# Patient Record
Sex: Female | Born: 2000 | Race: White | Hispanic: No | Marital: Single | Smoking: Never smoker
Health system: Southern US, Community
[De-identification: ages and names within clinical notes are randomized; demographics above are authoritative.]

## PROBLEM LIST (undated history)

## (undated) DIAGNOSIS — I10 Essential (primary) hypertension: Secondary | ICD-10-CM

## (undated) HISTORY — DX: Essential (primary) hypertension: I10

## (undated) HISTORY — PX: NO PAST SURGERIES: SHX2092

---
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
## 2020-04-01 ENCOUNTER — Other Ambulatory Visit: Payer: Self-pay | Admitting: Family Medicine

## 2020-04-01 DIAGNOSIS — I10 Essential (primary) hypertension: Secondary | ICD-10-CM

## 2020-04-09 ENCOUNTER — Ambulatory Visit
Admission: RE | Admit: 2020-04-09 | Discharge: 2020-04-09 | Disposition: A | Discharge status: Home / Self Care | Payer: Medicaid Other | Source: Ambulatory Visit | Attending: Family Medicine | Admitting: Family Medicine

## 2020-04-09 DIAGNOSIS — I10 Essential (primary) hypertension: Secondary | ICD-10-CM

## 2020-07-16 ENCOUNTER — Emergency Department (HOSPITAL_COMMUNITY)
Admission: EM | Admit: 2020-07-16 | Discharge: 2020-07-16 | Discharge status: Against Medical Advice | Payer: BLUE CROSS/BLUE SHIELD

## 2020-07-16 NOTE — ED Notes (Signed)
Pt called for triage x3, no answer. 

## 2020-07-16 NOTE — ED Notes (Signed)
No answer from pt in waiting room 

## 2021-12-27 IMAGING — US US RENAL ARTERY STENOSIS
1 series · 13 of 25 positions shown · non-contrast
Comparison: None.

CLINICAL DATA: Hypertension

EXAM:
RENAL DUPLEX DOPPLER ULTRASOUND
TECHNIQUE: Duplex and color Doppler ultrasound was utilized to evaluate blood
flow in the renal arteries and kidneys.

[Series 1: us renal artery stenosis · 0.23mm/px · 13 of 93 slices shown]
[im 1/93]
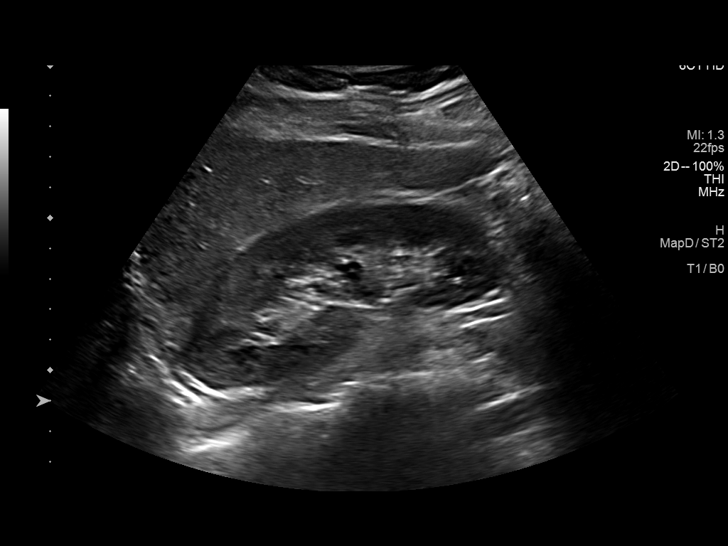
[im 8/93]
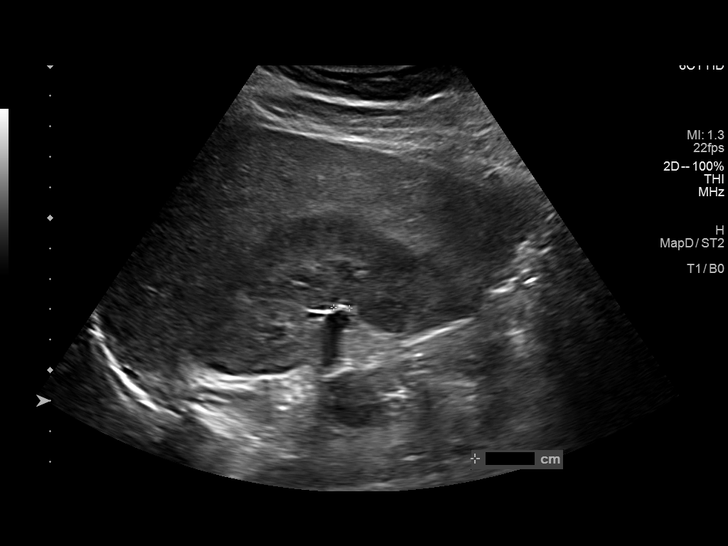
[im 16/93]
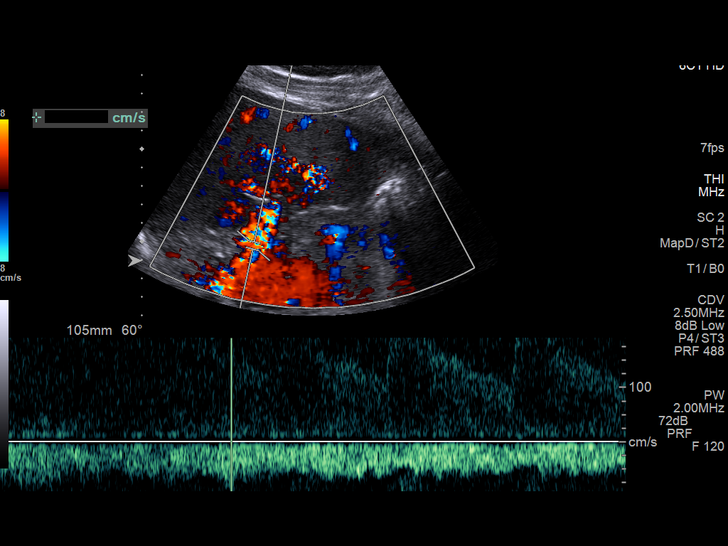
[im 24/93]
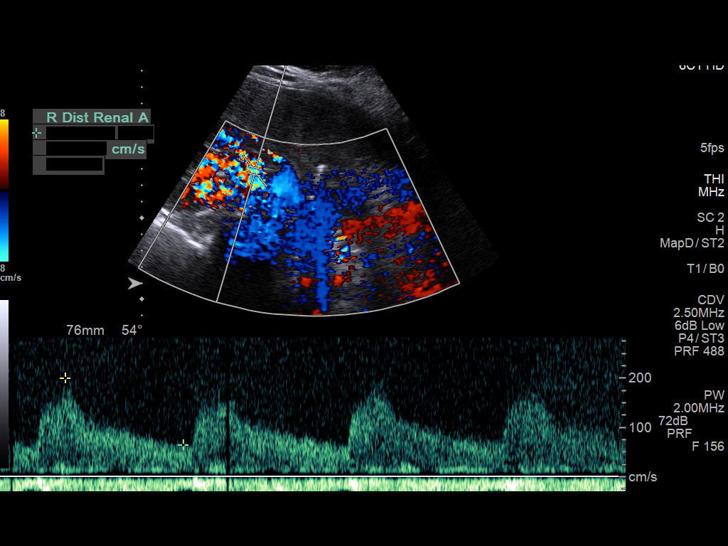
[im 31/93]
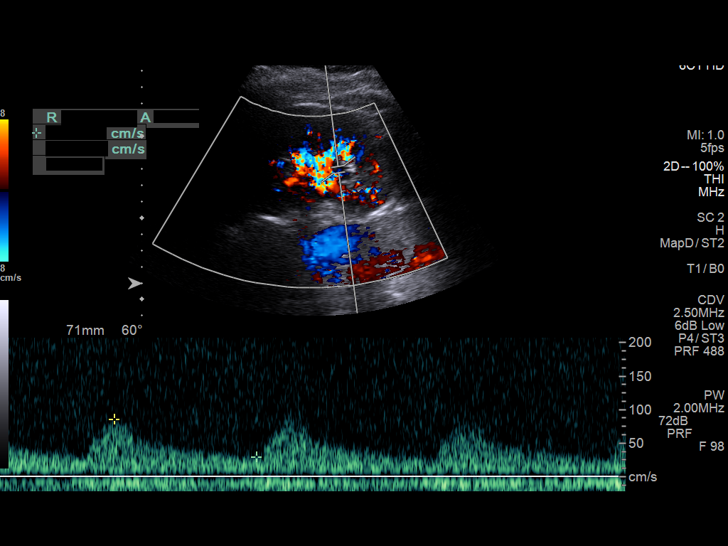
[im 39/93]
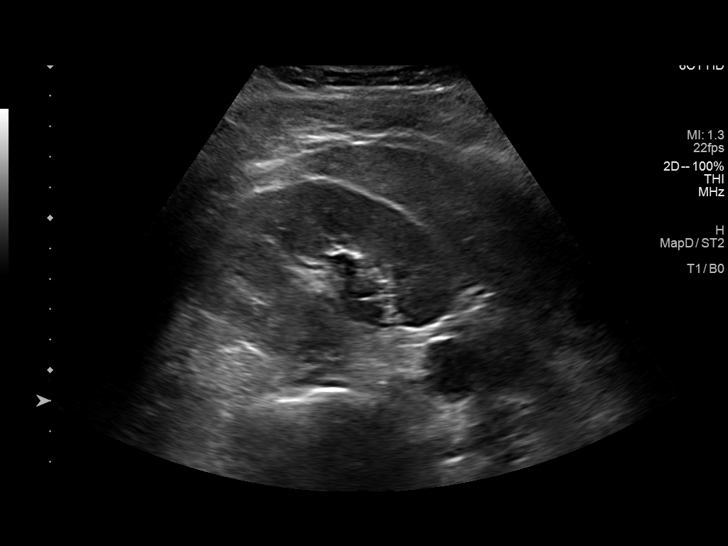
[im 47/93]
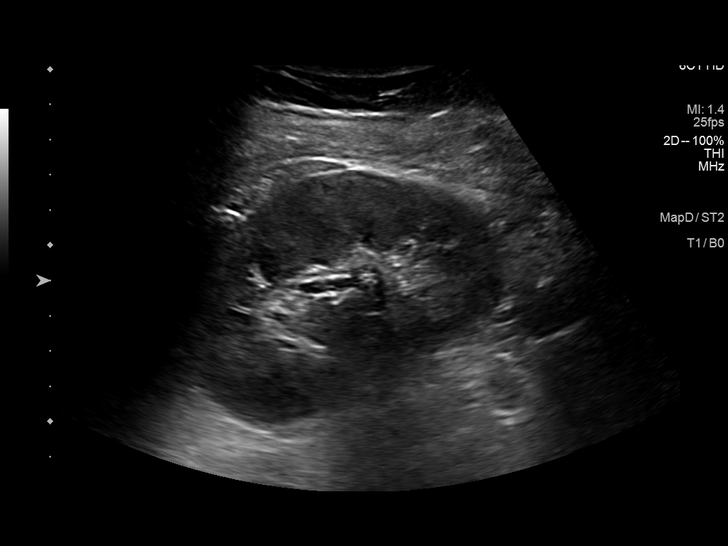
[im 54/93]
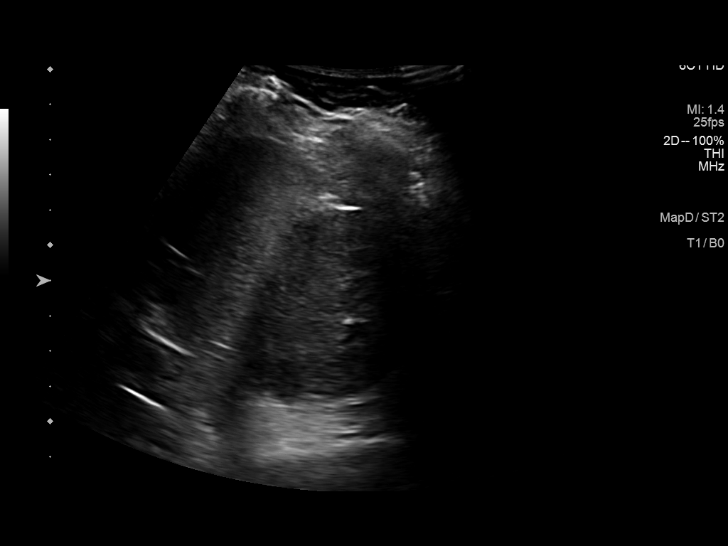
[im 62/93]
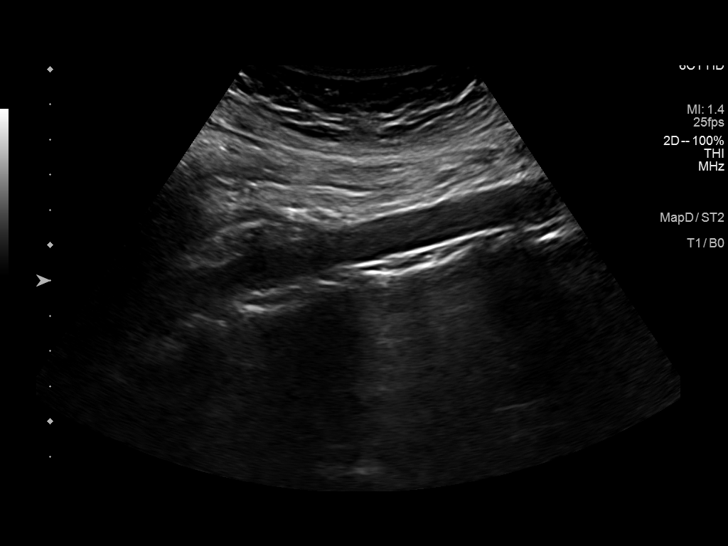
[im 70/93]
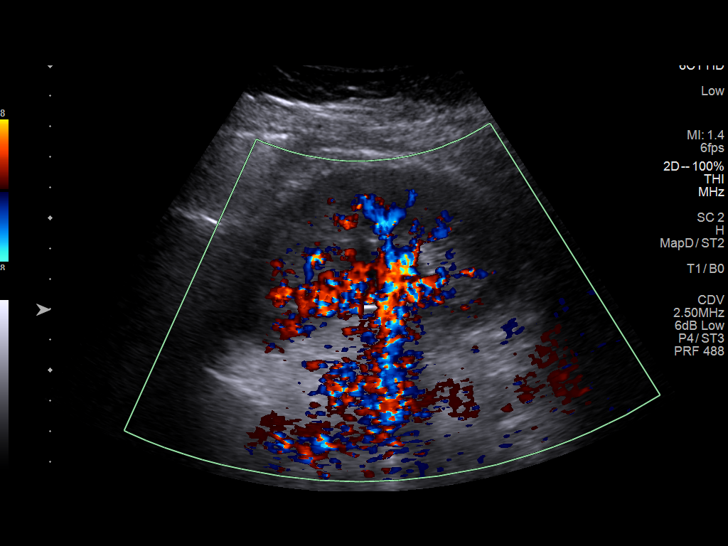
[im 77/93]
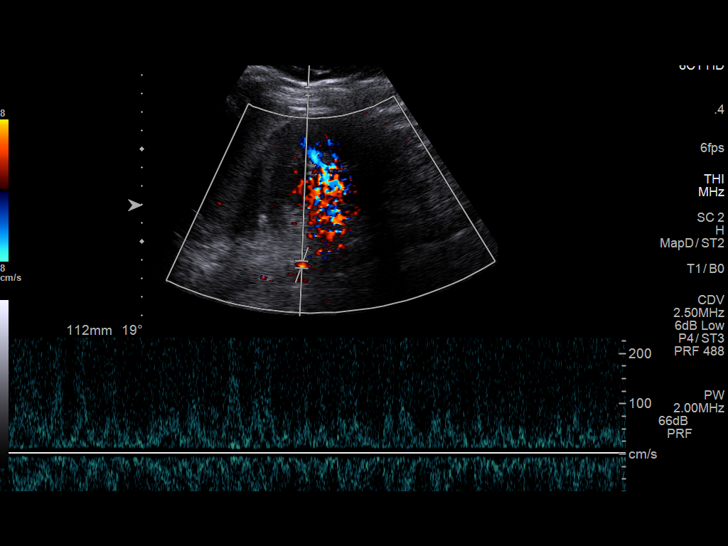
[im 85/93]
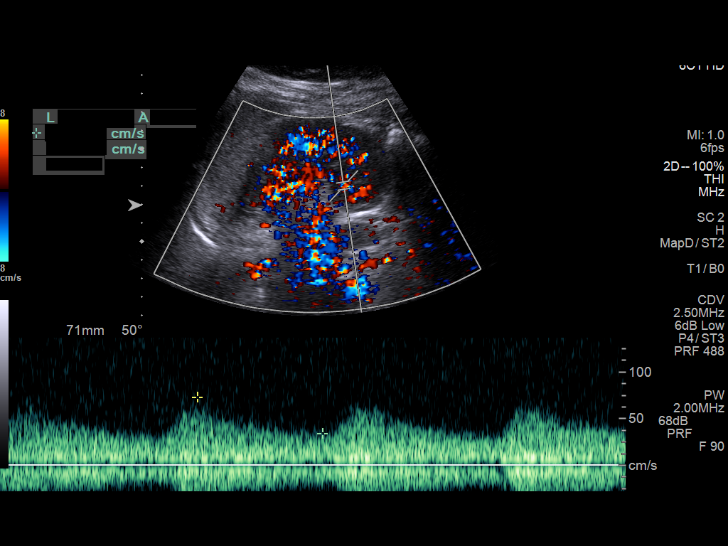
[im 93/93]
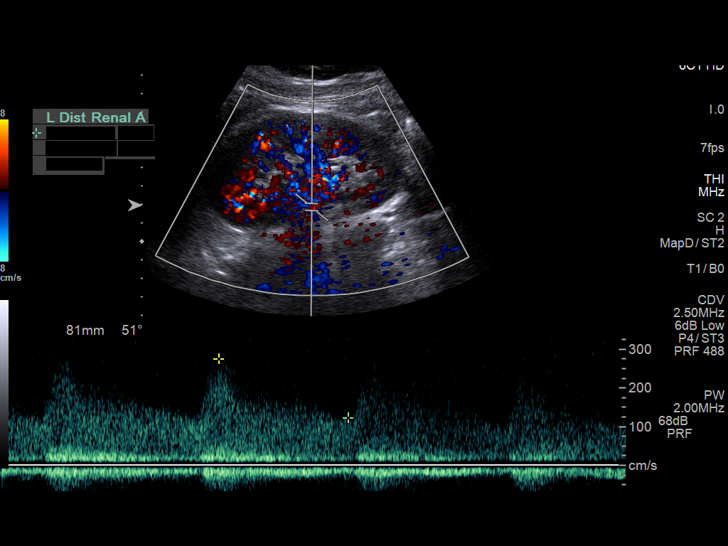

[13 of 25 positions shown; findings below may reference images not displayed]

FINDINGS: Right kidney 11.6 cm length. 1.3 cm echogenic shadowing focus
centrally in the nondilated collecting system. Renal vein patent at
hilum. Renal Artery Velocities:

Origin:  215 cm/sec

Mid:  251 cm/sec

Hilum:  247 cm/sec

Interlobar:  76 cm/sec

Arcuate: 54 cm/sec

Left kidney 10.3 cm length. No hydronephrosis. Renal vein patent at
hilum. Renal Artery Velocities:

Origin:  187 cm/sec

Mid:  148 cm/sec

Hilum:  276 cm/sec

Interlobar:  75 cm/sec

Arcuate:  31 cm/sec

Aortic Velocity: 176 cm/sec

Right Renal-Aortic Ratios:

Origin:

Mid:

Hilum:

Interlobar:

Arcuate:

Left Renal-Aortic Ratios:

Origin: 1

Mid:

Hilum:

Interlobar:

Arcuate:

Urinary bladder incompletely distended.
IMPRESSION: 1. No convincing Doppler evidence of hemodynamically significant
renal artery stenosis. If there is continued clinical concern, renal
MRA (lower radiation risk, can be performed noncontrast in the
setting of renal dysfunction) and CTA ( higher spatial resolution)
represent more accurate studies, which are additionally more
sensitive to the detection of duplicated renal arteries.
2. Possible right nephrolithiasis, without hydronephrosis.

## 2022-03-16 ENCOUNTER — Other Ambulatory Visit: Payer: Self-pay | Admitting: Obstetrics and Gynecology

## 2022-03-16 ENCOUNTER — Other Ambulatory Visit (HOSPITAL_COMMUNITY)
Admission: RE | Admit: 2022-03-16 | Discharge: 2022-03-16 | Disposition: A | Discharge status: Home / Self Care | Payer: Medicaid Other | Source: Ambulatory Visit | Attending: Obstetrics and Gynecology | Admitting: Obstetrics and Gynecology

## 2022-03-16 DIAGNOSIS — Z01419 Encounter for gynecological examination (general) (routine) without abnormal findings: Secondary | ICD-10-CM | POA: Insufficient documentation

## 2022-03-19 LAB — CYTOLOGY - PAP
Adequacy: ABSENT
Diagnosis: NEGATIVE

## 2023-03-22 ENCOUNTER — Other Ambulatory Visit (HOSPITAL_COMMUNITY)
Admission: RE | Admit: 2023-03-22 | Discharge: 2023-03-22 | Disposition: A | Discharge status: Home / Self Care | Payer: Medicaid Other | Source: Ambulatory Visit

## 2023-03-22 ENCOUNTER — Other Ambulatory Visit: Payer: Self-pay | Admitting: Obstetrics and Gynecology

## 2023-03-22 DIAGNOSIS — Z01419 Encounter for gynecological examination (general) (routine) without abnormal findings: Secondary | ICD-10-CM | POA: Diagnosis present

## 2023-03-23 LAB — CYTOLOGY - PAP: Diagnosis: NEGATIVE

## 2023-07-25 ENCOUNTER — Encounter (HOSPITAL_COMMUNITY): Payer: Self-pay

## 2023-07-25 ENCOUNTER — Other Ambulatory Visit: Payer: Self-pay

## 2023-07-25 ENCOUNTER — Emergency Department (HOSPITAL_COMMUNITY)
Admission: EM | Admit: 2023-07-25 | Discharge: 2023-07-26 | Discharge status: Against Medical Advice | Payer: Medicaid Other | Attending: Emergency Medicine | Admitting: Emergency Medicine

## 2023-07-25 DIAGNOSIS — Z5321 Procedure and treatment not carried out due to patient leaving prior to being seen by health care provider: Secondary | ICD-10-CM | POA: Insufficient documentation

## 2023-07-25 DIAGNOSIS — J029 Acute pharyngitis, unspecified: Secondary | ICD-10-CM | POA: Diagnosis present

## 2023-07-25 LAB — RESP PANEL BY RT-PCR (RSV, FLU A&B, COVID)  RVPGX2
Influenza A by PCR: NEGATIVE
Influenza B by PCR: NEGATIVE
Resp Syncytial Virus by PCR: NEGATIVE
SARS Coronavirus 2 by RT PCR: NEGATIVE

## 2023-07-25 NOTE — ED Notes (Signed)
Pt wanted to wait until she is in a room for blood word and didn't want the step swab due to having one done yesterday

## 2023-07-25 NOTE — ED Provider Triage Note (Signed)
Emergency Medicine Provider Triage Evaluation Note  Anna Holt , a 22 y.o. female  was evaluated in triage.  Pt complains of sore throat. Ongoing sore throat x 5 days.  Was diagnosed with exudative pharyngitis initially and have been taking augmentin without relief.  Subjective fever, and trouble swallowing  Review of Systems  Positive: As above Negative: As above  Physical Exam  BP (!) 150/117 (BP Location: Left Arm)   Pulse 91   Temp 99.2 F (37.3 C) (Oral)   Resp 16   Ht 5\' 8"  (1.727 m)   Wt 83.5 kg   LMP 06/21/2023 (Exact Date)   SpO2 100%   BMI 27.98 kg/m  Gen:   Awake, no distress   Resp:  Normal effort  MSK:   Moves extremities without difficulty  Other:    Medical Decision Making  Medically screening exam initiated at 8:24 PM.  Appropriate orders placed.  Anna Holt was informed that the remainder of the evaluation will be completed by another provider, this initial triage assessment does not replace that evaluation, and the importance of remaining in the ED until their evaluation is complete.     Anna Helper, PA-C 07/25/23 2026

## 2023-07-25 NOTE — ED Notes (Signed)
[facility] said pt left

## 2023-07-25 NOTE — ED Triage Notes (Signed)
Pt complaining of sore throat with swollen tonsils since Thursday. Pt seen at [facility] walk in clinic, was sent home with ABX without improvement.

## 2023-07-27 ENCOUNTER — Other Ambulatory Visit: Payer: Self-pay

## 2023-07-27 ENCOUNTER — Emergency Department (HOSPITAL_COMMUNITY)
Admission: EM | Admit: 2023-07-27 | Discharge: 2023-07-27 | Disposition: A | Discharge status: Home / Self Care | Payer: Medicaid Other | Attending: Emergency Medicine | Admitting: Emergency Medicine

## 2023-07-27 ENCOUNTER — Emergency Department (HOSPITAL_COMMUNITY): Payer: Medicaid Other

## 2023-07-27 DIAGNOSIS — R509 Fever, unspecified: Secondary | ICD-10-CM | POA: Insufficient documentation

## 2023-07-27 DIAGNOSIS — J029 Acute pharyngitis, unspecified: Secondary | ICD-10-CM | POA: Diagnosis present

## 2023-07-27 DIAGNOSIS — R1310 Dysphagia, unspecified: Secondary | ICD-10-CM | POA: Insufficient documentation

## 2023-07-27 DIAGNOSIS — I10 Essential (primary) hypertension: Secondary | ICD-10-CM | POA: Diagnosis not present

## 2023-07-27 LAB — CBC WITH DIFFERENTIAL/PLATELET
Abs Immature Granulocytes: 0.05 10*3/uL (ref 0.00–0.07)
Basophils Absolute: 0.1 10*3/uL (ref 0.0–0.1)
Basophils Relative: 1 %
Eosinophils Absolute: 0 10*3/uL (ref 0.0–0.5)
Eosinophils Relative: 0 %
HCT: 43.1 % (ref 36.0–46.0)
Hemoglobin: 13.9 g/dL (ref 12.0–15.0)
Immature Granulocytes: 0 %
Lymphocytes Relative: 19 %
Lymphs Abs: 2.1 10*3/uL (ref 0.7–4.0)
MCH: 27.4 pg (ref 26.0–34.0)
MCHC: 32.3 g/dL (ref 30.0–36.0)
MCV: 84.8 fL (ref 80.0–100.0)
Monocytes Absolute: 1.2 10*3/uL — ABNORMAL HIGH (ref 0.1–1.0)
Monocytes Relative: 11 %
Neutro Abs: 7.8 10*3/uL — ABNORMAL HIGH (ref 1.7–7.7)
Neutrophils Relative %: 69 %
Platelets: 236 10*3/uL (ref 150–400)
RBC: 5.08 MIL/uL (ref 3.87–5.11)
RDW: 12.9 % (ref 11.5–15.5)
WBC: 11.3 10*3/uL — ABNORMAL HIGH (ref 4.0–10.5)
nRBC: 0 % (ref 0.0–0.2)

## 2023-07-27 LAB — COMPREHENSIVE METABOLIC PANEL
ALT: 11 U/L (ref 0–44)
AST: 18 U/L (ref 15–41)
Albumin: 3.8 g/dL (ref 3.5–5.0)
Alkaline Phosphatase: 56 U/L (ref 38–126)
Anion gap: 13 (ref 5–15)
BUN: 11 mg/dL (ref 6–20)
CO2: 21 mmol/L — ABNORMAL LOW (ref 22–32)
Calcium: 9.1 mg/dL (ref 8.9–10.3)
Chloride: 101 mmol/L (ref 98–111)
Creatinine, Ser: 0.81 mg/dL (ref 0.44–1.00)
GFR, Estimated: >60 mL/min (ref >60)
Glucose, Bld: 87 mg/dL (ref 70–99)
Potassium: 3.7 mmol/L (ref 3.5–5.1)
Sodium: 135 mmol/L (ref 135–145)
Total Bilirubin: 0.2 mg/dL (ref 0.0–1.2)
Total Protein: 8 g/dL (ref 6.5–8.1)

## 2023-07-27 LAB — I-STAT CHEM 8, ED
BUN: 11 mg/dL (ref 6–20)
Calcium, Ion: 1.06 mmol/L — ABNORMAL LOW (ref 1.15–1.40)
Chloride: 102 mmol/L (ref 98–111)
Creatinine, Ser: 0.8 mg/dL (ref 0.44–1.00)
Glucose, Bld: 88 mg/dL (ref 70–99)
HCT: 43 % (ref 36.0–46.0)
Hemoglobin: 14.6 g/dL (ref 12.0–15.0)
Potassium: 3.6 mmol/L (ref 3.5–5.1)
Sodium: 135 mmol/L (ref 135–145)
TCO2: 22 mmol/L (ref 22–32)

## 2023-07-27 LAB — HCG, SERUM, QUALITATIVE: Preg, Serum: NEGATIVE

## 2023-07-27 LAB — GROUP A STREP BY PCR: Group A Strep by PCR: NOT DETECTED

## 2023-07-27 LAB — I-STAT CG4 LACTIC ACID, ED: Lactic Acid, Venous: 1.1 mmol/L (ref 0.5–1.9)

## 2023-07-27 LAB — MONONUCLEOSIS SCREEN: Mono Screen: NEGATIVE

## 2023-07-27 MED ORDER — DEXAMETHASONE SODIUM PHOSPHATE 10 MG/ML IJ SOLN
10.0000 mg | Freq: Once | INTRAMUSCULAR | Status: AC
  Administered 2023-07-27: 10 mg via INTRAVENOUS
  Filled 2023-07-27: qty 1

## 2023-07-27 MED ORDER — KETOROLAC TROMETHAMINE 15 MG/ML IJ SOLN
15.0000 mg | Freq: Once | INTRAMUSCULAR | Status: AC
  Administered 2023-07-27: 15 mg via INTRAVENOUS
  Filled 2023-07-27: qty 1

## 2023-07-27 MED ORDER — PREDNISONE 20 MG PO TABS: 40.0000 mg | ORAL_TABLET | Freq: Every day | ORAL | 0 refills | Status: DC

## 2023-07-27 MED ORDER — LIDOCAINE VISCOUS HCL 2 % MT SOLN: 15.0000 mL | OROMUCOSAL | 0 refills | Status: DC | PRN

## 2023-07-27 MED ORDER — IOHEXOL 350 MG/ML SOLN
75.0000 mL | Freq: Once | INTRAVENOUS | Status: AC | PRN
  Administered 2023-07-27: 75 mL via INTRAVENOUS

## 2023-07-27 MED ORDER — ACETAMINOPHEN 500 MG PO TABS
1000.0000 mg | ORAL_TABLET | Freq: Once | ORAL | Status: AC
  Administered 2023-07-27: 1000 mg via ORAL
  Filled 2023-07-27: qty 2

## 2023-07-27 MED ORDER — CLINDAMYCIN HCL 150 MG PO CAPS: 450.0000 mg | ORAL_CAPSULE | Freq: Three times a day (TID) | ORAL | 0 refills | Status: AC

## 2023-07-27 MED ORDER — SODIUM CHLORIDE 0.9 % IV BOLUS
1000.0000 mL | Freq: Once | INTRAVENOUS | Status: AC
  Administered 2023-07-27: 1000 mL via INTRAVENOUS

## 2023-07-27 MED ORDER — CLINDAMYCIN HCL 150 MG PO CAPS
450.0000 mg | ORAL_CAPSULE | Freq: Once | ORAL | Status: AC
  Administered 2023-07-27: 450 mg via ORAL
  Filled 2023-07-27: qty 3

## 2023-07-27 NOTE — ED Notes (Signed)
 Patient verbalizes understanding of discharge instructions. Opportunity for questioning and answers were provided. Armband removed by staff, pt discharged from ED. Pt ambulatory to ED waiting room with steady gait.

## 2023-07-27 NOTE — ED Triage Notes (Signed)
 Patient sent from [facility] walk in clinic for possible pharyngeal abscess. Present x 5 days and has completed five days of abx without relief and now having difficulty swallowing.

## 2023-07-27 NOTE — ED Provider Notes (Signed)
 [place] EM[facility] DEPARTMENT AT [facility] Provider Note   CSN: [id number] Arrival date & time: 07/27/23  1211     History Chief Complaint  Patient presents with   Abscess    Anna Holt is a 22 y.o. female with h/o HTN presents to the ER for evaluation of sore throat for the past 6 days.  She reports that she was on penicillin for 2 days however did not have any improvement with back to [place].  She was then put on Augmentin.  Still was not having improvement of symptoms went back to [facility] and was told to return to the ER.  She went to [place] however he left before being seen.  Shorts that she is been having fevers off and on for the past few days.  Tmax 101.15F yesterday.  She denies any runny nose or nasal congestion.  She reports pain with swallowing but no trouble swallowing or any trouble breathing.  She took ibuprofen today at 0600.  She denies any drooling.  Denies any rashes or cough.  She had her tonsils and adenoids removed 14 years ago.  Has had tympanic tubes put in as well.  Daily medications includes metoprolol.  She is allergic to lisinopril.  Vapes.  Denies any consistent EtOH use.  Denies any illicit drug use.   Abscess Associated symptoms: fever   Associated symptoms: no nausea and no vomiting        Home Medications Prior to Admission medications   Not on File      Allergies    Lisinopril    Review of Systems   Review of Systems  Constitutional:  Positive for chills and fever.  HENT:  Positive for sore throat. Negative for congestion, rhinorrhea and trouble swallowing.   Respiratory:  Negative for shortness of breath.   Gastrointestinal:  Negative for abdominal pain, nausea and vomiting.  Genitourinary:  Negative for dysuria and hematuria.  Skin:  Negative for rash.    Physical Exam Updated Vital Signs BP (!) 160/115   Pulse 94   Temp 99.7 F (37.6 C) (Oral)   Resp 19   Ht 5' 7 (1.702 m)   Wt 81.2 kg   LMP 06/21/2023 (Exact  Date)   SpO2 100%   BMI 28.04 kg/m  Physical Exam Vitals and nursing note reviewed.  Constitutional:      Appearance: She is not toxic-appearing.     Comments: Uncomfortable, but nontoxic-appearing  HENT:     Right Ear: Ear canal and external ear normal.     Left Ear: Ear canal and external ear normal.     Ears:     Comments: Scarring noted to bilateral TMs.  Patient reports this is chronic for her since she got tubes.    Mouth/Throat:     Mouth: Mucous membranes are moist.     Comments: Moist mucous membranes.  Uvula midline.  Airway patent.  Patient has classic hot potato voice.  No sublingual elevation.  No trismus.  Controlling secretions.  Patient has solid exudate present on the right side of the oropharynx.  It is 1+ swelling.  No significant pharyngeal erythema otherwise. Eyes:     General: No scleral icterus. Cardiovascular:     Rate and Rhythm: Normal rate.  Pulmonary:     Effort: Pulmonary effort is normal. No respiratory distress.     Breath sounds: Normal breath sounds. No stridor.  Lymphadenopathy:     Cervical: Cervical adenopathy present.  Skin:  General: Skin is warm and dry.  Neurological:     Mental Status: She is alert.     ED Results / Procedures / Treatments   Labs (all labs ordered are listed, but only abnormal results are displayed) Labs Reviewed  COMPREHENSIVE METABOLIC PANEL - Abnormal; Notable for the following components:      Result Value   CO2 21 (*)    All other components within normal limits  CBC WITH DIFFERENTIAL/PLATELET - Abnormal; Notable for the following components:   WBC 11.3 (*)    Neutro Abs 7.8 (*)    Monocytes Absolute 1.2 (*)    All other components within normal limits  I-STAT CHEM 8, ED - Abnormal; Notable for the following components:   Calcium, Ion 1.06 (*)    All other components within normal limits  GROUP A STREP BY PCR  HCG, SERUM, QUALITATIVE  MONONUCLEOSIS SCREEN  I-STAT CG4 LACTIC ACID, ED  I-STAT CG4  LACTIC ACID, ED    EKG None  Radiology CT Soft Tissue Neck W Contrast Result Date: 07/27/2023 CLINICAL DATA:  Epiglottitis or tonsillitis suspected. Difficulty swallowing. EXAM: CT NECK WITH CONTRAST TECHNIQUE: Multidetector CT imaging of the neck was performed using the standard protocol following the bolus administration of intravenous contrast. RADIATION DOSE REDUCTION: This exam was performed according to the departmental dose-optimization program which includes automated exposure control, adjustment of the mA and/or kV according to patient size and/or use of iterative reconstruction technique. CONTRAST:  75mL OMNIPAQUE  IOHEXOL  350 MG/ML SOLN COMPARISON:  None Available. FINDINGS: Pharynx and larynx: Edema and hyperenhancement of the adenoids, palatine tonsils and lingual tonsils, consistent with pharyngitis. Salivary glands: No inflammation, mass, or stone. Thyroid: Normal. Lymph nodes: Right-greater-than-left upper cervical lymphadenopathy, likely reactive. Vascular: Normal. Limited intracranial: Within limits of motion artifact, no significant abnormality. Visualized orbits: Evaluation is limited by motion artifact. Mastoids and visualized paranasal sinuses: Trace mucosal thickening in the left maxillary sinus, likely physiologic. Mastoids are well aerated. Skeleton: Normal. Upper chest: Unremarkable. Other: None. IMPRESSION: 1. Edema and hyperenhancement of the adenoids, palatine tonsils and lingual tonsils, consistent with pharyngitis. No evidence of peritonsillar abscess. 2. Right-greater-than-left upper cervical lymphadenopathy, likely reactive. Electronically Signed   By: Ryan Chess M.D.   On: 07/27/2023 18:43    Procedures Procedures   Medications Ordered in ED Medications  ketorolac  (TORADOL ) 15 MG/ML injection 15 mg (15 mg Intravenous Given 07/27/23 1646)  acetaminophen  (TYLENOL ) tablet 1,000 mg (1,000 mg Oral Given 07/27/23 1648)  iohexol  (OMNIPAQUE ) 350 MG/ML injection 75 mL (75  mLs Intravenous Contrast Given 07/27/23 1710)    ED Course/ Medical Decision Making/ A&P                               Medical Decision Making Amount and/or Complexity of Data Reviewed Labs: ordered. Radiology: ordered.  Risk OTC drugs. Prescription drug management.   23 y.o. female presents to the ER for evaluation of sore throat/pharyngeal abscess. Differential diagnosis includes but is not limited to Viral pharyngitis, strep pharyngitis, dental caries/abscess, esophagitis, sinusitis, post nasal drip, reflux, angioedema, RTA/PTA, Ludwig's angina. Vital signs elevated BP, temp 99.78F, otherwise unremarkable. Physical exam as noted above.   Some labs ordered in triage. I have reminded nursing about a lactic needing to be collected. Mono and strep ordered as well. CT soft tissue ordered to evaluate if potential abscess.  Ordered the patient Tylenol  and Toradol .  I independently reviewed and interpreted the patient's labs.  hCG negative.  CBC shows white blood cell count elevated 11.3 with a left shift.  CMP shows bicarb decreased at 21 otherwise no electrolyte or LFT abnormality.  Mono negative.  Strep negative.  Lactic within normal limits.  CT soft tissue neck shows  1. Edema and hyperenhancement of the adenoids, palatine tonsils and  lingual tonsils, consistent with pharyngitis. No evidence of  peritonsillar abscess.  2. Right-greater-than-left upper cervical lymphadenopathy, likely  reactive.  Per radiologist's interpretation.    I spoke with Dr. Roark with ear nose and throat.  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  Recommend switching antibiotics to clindamycin .  Giving some Decadron  here and sending home with prednisone .  Will have her follow-up with her primary care doctor.  Offered her shared decision with admission versus discharge home.  I discussed admission versus discharge home with patient.  We had a shared decision making.  Patient reports he is feeling so  much better after the Toradol  and Tylenol .  I agree that her voice is improved.  She reports that it is not causing her as much pain to swallow.  She has been able to drink without issue.  I given her another dose of Toradol  given it has been 6 hours later.  I will send her home with the clindamycin .  First dose given today.  Also send her home with some prednisone  and viscous lidocaine .  She is controlling secretions.  She reports that she feels significantly better than she did earlier. She would like to go home. Recommended she follow up with PCP.   We discussed the results of the labs/imaging. The plan is take medications as prescribed, follow up with PCP. We discussed strict return precautions and red flag symptoms. The patient verbalized their understanding and agrees to the plan. The patient is stable and being discharged home in good condition.  Portions of this report may have been transcribed using voice recognition software. Every effort was made to ensure accuracy; however, inadvertent computerized transcription errors may be present.   Final Clinical Impression(s) / ED Diagnoses Final diagnoses:  Pharyngitis, unspecified etiology    Rx / DC Orders ED Discharge Orders          Ordered    clindamycin  (CLEOCIN ) 150 MG capsule  3 times daily        07/27/23 2033    predniSONE  (DELTASONE ) 20 MG tablet  Daily        07/27/23 2238    lidocaine  (XYLOCAINE ) 2 % solution  As needed        07/27/23 2243              Bernis Ernst, PA-C 07/27/23 2253    Laurice Maude BROCKS, MD 07/28/23 0130

## 2023-07-27 NOTE — Discharge Instructions (Addendum)
 You were seen in the ER today for evaluation of your sore throat. Your imaging shows that you have pharyngitis. There was no abscess seen. For this, I am going to switch you to an antibiotic called clindamycin  which you will take three times a day for the next ten days. You can use your viscous lidocaine  mouthwash as needed. For pain, I recommend 600mg  of ibuprofen and 1000mg  of Tylenol  every 6 hours as needed for pain. Make sure that you are staying well hydrated, drinking plenty of fluids, mainly water. Please make sure you follow up with your PCP for re-evaluation. If you have any concerns, new or worsening symptoms, please return to the nearest ER for re-evaluation.   Contact a doctor if: You have large, tender lumps in your neck. You have a rash. You cough up green, yellow-brown, or bloody spit. Get help right away if: You have a stiff neck. You drool or cannot swallow liquids. You cannot drink or take medicines without vomiting. You have very bad pain that does not go away with medicine. You have problems breathing, and it is not from a stuffy nose. You have new pain and swelling in your knees, ankles, wrists, or elbows. These symptoms may be an emergency. Get help right away. Call your local emergency services (911 in the [place] Do not wait to see if the symptoms will go away. Do not drive yourself to the hospital.

## 2023-07-31 ENCOUNTER — Emergency Department (HOSPITAL_COMMUNITY)
Admission: EM | Admit: 2023-07-31 | Discharge: 2023-07-31 | Disposition: A | Discharge status: Home / Self Care | Payer: Medicaid Other | Attending: Emergency Medicine | Admitting: Emergency Medicine

## 2023-07-31 DIAGNOSIS — J029 Acute pharyngitis, unspecified: Secondary | ICD-10-CM | POA: Diagnosis present

## 2023-07-31 DIAGNOSIS — Z20822 Contact with and (suspected) exposure to covid-19: Secondary | ICD-10-CM | POA: Diagnosis not present

## 2023-07-31 LAB — GROUP A STREP BY PCR: Group A Strep by PCR: NOT DETECTED

## 2023-07-31 LAB — RESP PANEL BY RT-PCR (RSV, FLU A&B, COVID)  RVPGX2
Influenza A by PCR: NEGATIVE
Influenza B by PCR: NEGATIVE
Resp Syncytial Virus by PCR: NEGATIVE
SARS Coronavirus 2 by RT PCR: NEGATIVE

## 2023-07-31 LAB — MONONUCLEOSIS SCREEN: Mono Screen: NEGATIVE

## 2023-07-31 MED ORDER — METHYLPREDNISOLONE 4 MG PO TBPK: ORAL_TABLET | ORAL | 0 refills | Status: DC

## 2023-07-31 NOTE — ED Provider Triage Note (Signed)
 Emergency Medicine Provider Triage Evaluation Note  Anna Holt , a 23 y.o. female  was evaluated in triage.  Pt complains of Sore throat x 1 wk.  Review of Systems  Positive: Sore throat, pain from neck rad to ear/mastoid Negative: Fever, chills, N/V, body aches, congestion, cough,   Physical Exam  LMP 06/21/2023 (Exact Date)  Gen:   Awake, no distress   Resp:  Normal effort  MSK:   Moves extremities without difficulty  Other:  Exudate on pharynx, post tonsillectomy, R sided cervical lymphadenopathy  Medical Decision Making  Medically screening exam initiated at 11:46 AM.  Appropriate orders placed.  Delaina Wickware was informed that the remainder of the evaluation will be completed by another provider, this initial triage assessment does not replace that evaluation, and the importance of remaining in the ED until their evaluation is complete.  Labs sent   Francis Ileana SAILOR, PA-C 07/31/23 1152

## 2023-07-31 NOTE — ED Triage Notes (Signed)
 Pt c/o sore throat for several days. On abx since 1/1.

## 2023-07-31 NOTE — Discharge Instructions (Addendum)
 Today you were seen for sore throat.  Pick up your medication and take as prescribed.  Please see the attached handout for symptomatic management.  You have 1 or more labs pending and will be notified once these labs results and be given appropriate treatment.  Thank you for letting us  treat you today. After performing a physical exam and reviewing your labs, I feel you are safe to go home. Please follow up with your PCP in the next several days and provide them with your records from this visit. Return to the Emergency Room if pain becomes severe or symptoms worsen.

## 2023-07-31 NOTE — ED Provider Notes (Signed)
  EMERGENCY DEPARTMENT AT [facility] Provider Note   CSN: [id number] Arrival date & time: 07/31/23  1140     History  Chief Complaint  Patient presents with   Sore Throat    Anna Holt is a 23 y.o. female presents today for sore throat x 1 week.  Patient endorses sore throat and pain on the right side of her throat radiating up to ear/mastoid area.  Patient denies fever, chills, nausea, vomiting, body aches, congestion, or cough.  Patient denies shortness of breath or chest pain.  Patient is status post tonsillectomy.   Sore Throat       Home Medications Prior to Admission medications   Medication Sig Start Date End Date Taking? Authorizing Provider  methylPREDNISolone  (MEDROL  DOSEPAK) 4 MG TBPK tablet Take as packaging instructs 07/31/23  Yes Adah Stoneberg N, PA-C  clindamycin  (CLEOCIN ) 150 MG capsule Take 3 capsules (450 mg total) by mouth 3 (three) times daily for 10 days. 07/27/23 08/06/23  Bernis Ernst, PA-C  lidocaine  (XYLOCAINE ) 2 % solution Use as directed 15 mLs in the mouth or throat as needed for mouth pain. 07/27/23   Bernis Ernst, PA-C      Allergies    Lisinopril    Review of Systems   Review of Systems  HENT:  Positive for sore throat.     Physical Exam Updated Vital Signs BP (!) 148/91   Pulse 92   Temp (!) 97.3 F (36.3 C) (Oral)   Resp 18   LMP 06/21/2023 (Exact Date)   SpO2 100%  Physical Exam Constitutional:      Appearance: She is well-developed.  HENT:     Head: Normocephalic.     Right Ear: Ear canal normal. No mastoid tenderness. Tympanic membrane is scarred. Tympanic membrane is not erythematous.     Left Ear: Ear canal normal. No mastoid tenderness. Tympanic membrane is scarred. Tympanic membrane is not erythematous.     Ears:     Comments: Scarring on bilateral TMs from previous tympanostomy    Nose: No congestion.     Mouth/Throat:     Mouth: Mucous membranes are moist.     Tongue: No lesions. Tongue does not  deviate from midline.     Pharynx: Uvula midline. Oropharyngeal exudate present. No posterior oropharyngeal erythema or uvula swelling.     Tonsils: No tonsillar exudate or tonsillar abscesses.  Eyes:     Conjunctiva/sclera: Conjunctivae normal.  Neck:     Comments: Mild right sided cervical lymphadenopathy Cardiovascular:     Rate and Rhythm: Normal rate.     Heart sounds: Normal heart sounds.  Musculoskeletal:     Cervical back: Normal range of motion.  Lymphadenopathy:     Cervical: Cervical adenopathy present.  Neurological:     Mental Status: She is alert.     ED Results / Procedures / Treatments   Labs (all labs ordered are listed, but only abnormal results are displayed) Labs Reviewed  GROUP A STREP BY PCR  RESP PANEL BY RT-PCR (RSV, FLU A&B, COVID)  [id number]  AEROBIC CULTURE W GRAM STAIN (SUPERFICIAL SPECIMEN)  MONONUCLEOSIS SCREEN    EKG None  Radiology No results found.  Procedures Procedures    Medications Ordered in ED Medications - No data to display  ED Course/ Medical Decision Making/ A&P  Medical Decision Making Amount and/or Complexity of Data Reviewed Labs: ordered.   This patient presents to the ED with chief complaint(s) of sore throat with pertinent past medical history of none which further complicates the presenting complaint. The complaint involves an extensive differential diagnosis and also carries with it a high risk of complications and morbidity.    The differential diagnosis includes strep pharyngitis, COVID, flu, mono, RSV  Additional history obtained: Records reviewed Care Everywhere/External Records  ED Course and Reassessment:   Independent labs interpretation:  The following labs were independently interpreted:  Respiratory panel: Negative Strep PCR: Negative Throat culture: Pending Monospot: Negative  Consultation: - Consulted or discussed management/test interpretation w/ external  professional: None  Consideration for admission or further workup: Considered for mission or further workup however patient's vital signs, physical exam, and labs have all been reassuring.  Patient is still pending throat culture result and will be treated appropriately once this test is resulted.  Patient should follow-up with PCP if symptoms persist for further evaluation and workup.  Patient will be given outpatient treatment with over-the-counter remedies such as salt water gargles and Medrol  Dosepak.         Final Clinical Impression(s) / ED Diagnoses Final diagnoses:  Sore throat    Rx / DC Orders ED Discharge Orders          Ordered    methylPREDNISolone  (MEDROL  DOSEPAK) 4 MG TBPK tablet        07/31/23 1648              Francis Ileana SAILOR, PA-C 07/31/23 1648    Mannie Pac T, DO 08/02/23 1522

## 2023-08-02 LAB — AEROBIC CULTURE W GRAM STAIN (SUPERFICIAL SPECIMEN): Gram Stain: NONE SEEN

## 2023-08-03 ENCOUNTER — Telehealth (HOSPITAL_BASED_OUTPATIENT_CLINIC_OR_DEPARTMENT_OTHER): Payer: Self-pay

## 2023-08-03 NOTE — Telephone Encounter (Signed)
 Post ED Visit - Positive Culture Follow-up  Culture report reviewed by antimicrobial stewardship pharmacist: [facility] [x]  Dorn Poot, Pharm.D. []  Venetia Gully, Pharm.D., BCPS AQ-ID []  Garrel Crews, Pharm.D., BCPS []  Almarie Lunger, Pharm.D., BCPS []  Damon, [place].D., BCPS, AAHIVP []  Rosaline Bihari, Pharm.D., BCPS, AAHIVP []  Vernell Meier, PharmD, BCPS []  Latanya Hint, PharmD, BCPS []  Donald Medley, PharmD, BCPS []  Rocky Bold, PharmD []  Dorothyann Alert, PharmD, BCPS []  Morene Babe, PharmD  [facility] []  Rosaline Edison, PharmD []  Romona Bliss, PharmD []  Dolphus Roller, PharmD []  Veva Seip, Rph []  Vernell Daunt) Leonce, PharmD []  Eva Allis, PharmD []  Rosaline Millet, PharmD []  Iantha Batch, PharmD []  Arvin Gauss, PharmD []  Wanda Hasting, PharmD []  Ronal Rav, PharmD []  Rocky Slade, PharmD []  Bard Jeans, PharmD   Positive throat culture No treatment needed and no further patient follow-up is required at this time.  Anna Holt 08/03/2023, 10:38 AM

## 2023-11-03 ENCOUNTER — Encounter (HOSPITAL_BASED_OUTPATIENT_CLINIC_OR_DEPARTMENT_OTHER): Payer: Self-pay | Admitting: Family

## 2023-11-03 ENCOUNTER — Ambulatory Visit (HOSPITAL_BASED_OUTPATIENT_CLINIC_OR_DEPARTMENT_OTHER): Admitting: Family

## 2023-11-03 VITALS — BP 146/90 | HR 83 | Ht 67.0 in | Wt 198.8 lb

## 2023-11-03 DIAGNOSIS — I10 Essential (primary) hypertension: Secondary | ICD-10-CM

## 2023-11-03 MED ORDER — NIFEDIPINE ER OSMOTIC RELEASE 30 MG PO TB24: 30.0000 mg | ORAL_TABLET | Freq: Every day | ORAL | 1 refills | Status: DC

## 2023-11-03 NOTE — Patient Instructions (Addendum)
 Medication Instructions:  Your physician has recommended you make the following change in your medication:   Stop: Metoprolol   Start: Nifedipine 30mg  daily    Labwork: Your physician recommends that you return for lab work in one week Renin-aldosterone, metanephrines, and catecholamines    Follow-Up: Please follow up in 2 months in [facility] with Dr. Duke Salvia, Gillian Shields, NP or Phillips Hay PharmD    Special Instructions:  We will check on your blood pressure in 2 weeks

## 2023-11-03 NOTE — Progress Notes (Signed)
 [facility]pertension Clinic Initial Assessment:    Date:  11/03/2023   ID:  Anna Holt, DOB August 18, 2000, MRN [id number]  PCP:  Irven Coe, MD  Cardiologist:  None  Nephrologist:  Referring MD: Irven Coe, MD   CC: Hypertension  History of Present Illness:    Anna Holt is a 23 y.o. female with a hx of hypertension here to establish care in the [facility]pert[facility].   Discussed the use of AI scribe software for clinical note transcription with the patient, who gave verbal consent to proceed.  History of Present Illness The patient, with a known history of hypertension diagnosed at 23 yo, presents with concerns about her current medication, metoprolol. She reports feeling sluggish and experiencing significant weight gain since starting the medication three years ago.  These symtpoms have persisted despite reducing dose from 100mg  to 50mg . Prior antihypertensive of Lisinopril which she did not tolerate due to dizziness, nausea. The patient has been monitoring her blood pressure manually at home and reports readings around 150/80. She expresses a desire to manage her hypertension with lifestyle modifications and potentially a different medication. The patient has a family history of hypertension in her mother and maternal grandfather. She also recently completed a sleep study due to concerns about sleep apnea and awaits results. The patient is active, engaging in weightlifting and cardio exercises multiple times a week. She reports a mix of home-cooked meals and eating out, with a noted preference for adding salt to her food. No tobacco use, alcohol use socially. Previously smoked THC but has since stopped.    Previous antihypertensives: Lisinopril - dizziness, nausea   Past Medical History:  Diagnosis Date   Hypertension     Past Surgical History:  Procedure Laterality Date   NO PAST SURGERIES      Current Medications: Current Meds  Medication Sig    NIFEdipine (PROCARDIA-XL/NIFEDICAL-XL) 30 MG 24 hr tablet Take 1 tablet (30 mg total) by mouth daily.   [DISCONTINUED] metoprolol succinate (TOPROL-XL) 50 MG 24 hr tablet Take 50 mg by mouth daily.     Allergies:   Lisinopril and Norethindrone   Social History   Socioeconomic History   Marital status: Single    Spouse name: Not on file   Number of children: Not on file   Years of education: Not on file   Highest education level: Not on file  Occupational History   Not on file  Tobacco Use   Smoking status: Never   Smokeless tobacco: Never  Substance and Sexual Activity   Alcohol use: Never   Drug use: Never   Sexual activity: Not on file  Other Topics Concern   Not on file  Social History Narrative   Not on file   Social Drivers of Health   Financial Resource Strain: Not on file  Food Insecurity: No Food Insecurity (11/03/2023)   Hunger Vital Sign    Worried About Running Out of Food in the Last Year: Never true    Ran Out of Food in the Last Year: Never true  Transportation Needs: No Transportation Needs (11/03/2023)   PRAPARE - Administrator, Civil Service (Medical): No    Lack of Transportation (Non-Medical): No  Physical Activity: Sufficiently Active (11/03/2023)   Exercise Vital Sign    Days of Exercise per Week: 5 days    Minutes of Exercise per Session: 60 min  Stress: Not on file  Social Connections: Not on file     Family History:  The patient's family history includes Hypertension in her maternal grandfather and mother.  ROS:   Please see the history of present illness.     All other systems reviewed and are negative.  EKGs/Labs/Other Studies Reviewed:    EKG Interpretation Date/Time:  Thursday November 03 2023 10:04:36 EDT Ventricular Rate:  85 PR Interval:  126 QRS Duration:  92 QT Interval:  386 QTC Calculation: 459 R Axis:   57  Text Interpretation: Normal sinus rhythm No acute ST/T wave changes Confirmed by Gillian Shields ([id number])  on 11/03/2023 11:46:40 AM    Recent Labs: 07/27/2023: ALT 11; BUN 11; Creatinine, Ser 0.80; Hemoglobin 14.6; Platelets 236; Potassium 3.6; Sodium 135   Recent Lipid Panel No results found for: "CHOL", "TRIG", "HDL", "CHOLHDL", "VLDL", "LDLCALC", "LDLDIRECT"  Physical Exam:   VS:  BP (!) 146/90   Pulse 83   Ht 5\' 7"  (1.702 m)   Wt 198 lb 12.8 oz (90.2 kg)   SpO2 100%   BMI 31.14 kg/m  , BMI Body mass index is 31.14 kg/m.  Vitals:   11/03/23 1009 11/03/23 1011 11/03/23 1044  BP: (!) 174/143 (!) 185/100 (!) 146/90  Pulse: 83    Height: 5\' 7"  (1.702 m)    Weight: 198 lb 12.8 oz (90.2 kg)    SpO2: 100%    BMI (Calculated): 31.13      GENERAL:  Well appearing HEENT: Pupils equal round and reactive, fundi not visualized, oral mucosa unremarkable NECK:  No jugular venous distention, waveform within normal limits, carotid upstroke brisk and symmetric, no bruits, no thyromegaly LYMPHATICS:  No cervical adenopathy LUNGS:  Clear to auscultation bilaterally HEART:  RRR.  PMI not displaced or sustained,S1 and S2 within normal limits, no S3, no S4, no clicks, no rubs, no  murmurs ABD:  Flat, positive bowel sounds normal in frequency in pitch, no bruits, no rebound, no guarding, no midline pulsatile mass, no hepatomegaly, no splenomegaly EXT:  2 plus pulses throughout, no edema, no cyanosis no clubbing SKIN:  No rashes no nodules NEURO:  Cranial nerves II through XII grossly intact, motor grossly intact throughout PSYCH:  Cognitively intact, oriented to person place and time   ASSESSMENT/PLAN:    Assessment and Plan Assessment & Plan Hypertension Hypertension diagnosed at 28 with family history. Current home readings 140-150s/80s mmHg. initial BP in clinic 174/143 and 185/100 with repeat 146/90 without intervention.  Previous treatments with lisinopril and metoprolol caused adverse effects.  - Discontinue metoprolol. - Initiate nifedipine 30 mg once daily. - Educated on low-sodium diet  and lifestyle modifications.  Ultimately her goal is to control blood pressure through lifestyle changes. - Recheck blood pressure in two weeks via MyChart. - Lab work next week plasma metanephrines, catecholamines, renin aldosterone.  Sleep Apnea (suspected) Sleep apnea suspected as a contributing factor to hypertension. - Established with [facility], await sleep study results.    Screening for Secondary Hypertension:     11/03/2023   11:52 AM  Causes  Drugs/Herbals Screened     - Comments no OTC agents, limits caffeine  Renovascular HTN Screened     - Comments 2021 normal renal arteries by duplex  Sleep Apnea Screened     - Comments 10/2023 recently completed sleep study, await results from [facility]  Thyroid Disease Screened     - Comments normal TSH  Hyperaldosteronism Screened     - Comments 11/03/23 renin-aldosterone ordered  Pheochromocytoma Screened     - Comments 11/03/23 metanephrines, catecholamines ordered  Cushing's Syndrome N/A     - Comments non cushingoid appearance  Coarctation of the Aorta N/A     - Comments BP symmetrical  Compliance Screened     - Comments Taking medication routinely    Relevant Labs/Studies:    Latest Ref Rng & Units 07/27/2023   12:51 PM 07/27/2023   12:39 PM  Basic Labs  Sodium 135 - 145 mmol/L 135  135   Potassium 3.5 - 5.1 mmol/L 3.6  3.7   Creatinine 0.44 - 1.00 mg/dL 8.11  9.14                     Disposition:    FU with MD/APP/PharmD in 2 months    Medication Adjustments/Labs and Tests Ordered: Current medicines are reviewed at length with the patient today.  Concerns regarding medicines are outlined above.  Orders Placed This Encounter  Procedures   Aldosterone + renin activity w/ ratio   Catecholamines, fractionated, plasma   Metanephrines, plasma   EKG 12-Lead   Meds ordered this encounter  Medications   NIFEdipine (PROCARDIA-XL/NIFEDICAL-XL) 30 MG 24 hr tablet    Sig: Take 1 tablet (30 mg  total) by mouth daily.    Dispense:  90 tablet    Refill:  1    Supervising Provider:   Jodelle Red [[id number]]     Signed, Alver Sorrow, NP  11/03/2023 11:53 AM    [facility]

## 2023-11-17 ENCOUNTER — Encounter (HOSPITAL_BASED_OUTPATIENT_CLINIC_OR_DEPARTMENT_OTHER): Payer: Self-pay

## 2023-12-19 ENCOUNTER — Other Ambulatory Visit: Payer: Self-pay

## 2023-12-19 ENCOUNTER — Encounter (HOSPITAL_COMMUNITY): Payer: Self-pay

## 2023-12-19 ENCOUNTER — Emergency Department (HOSPITAL_COMMUNITY)

## 2023-12-19 ENCOUNTER — Emergency Department (HOSPITAL_COMMUNITY)
Admission: EM | Admit: 2023-12-19 | Discharge: 2023-12-19 | Disposition: A | Discharge status: Home / Self Care | Attending: Emergency Medicine | Admitting: Emergency Medicine

## 2023-12-19 DIAGNOSIS — R059 Cough, unspecified: Secondary | ICD-10-CM | POA: Diagnosis present

## 2023-12-19 DIAGNOSIS — J069 Acute upper respiratory infection, unspecified: Secondary | ICD-10-CM | POA: Insufficient documentation

## 2023-12-19 DIAGNOSIS — Z79899 Other long term (current) drug therapy: Secondary | ICD-10-CM | POA: Diagnosis not present

## 2023-12-19 DIAGNOSIS — I10 Essential (primary) hypertension: Secondary | ICD-10-CM | POA: Insufficient documentation

## 2023-12-19 NOTE — ED Triage Notes (Signed)
 Pt states she has a raspy cough with mucous that started last week. Pt took abx that did not help. Pt also feels SHOB and that she can't take a deep breath in without pain. Pt able to talk in triage.

## 2023-12-19 NOTE — Discharge Instructions (Addendum)
 Chest x-ray is normal today.  Try zinc and vitamin C and stay hydrated and get rest.  This should improve on its own.  If you start running high fever, vomiting or can't breath return.

## 2023-12-19 NOTE — ED Provider Notes (Signed)
 [place] EMERGENCY DEPARTMENT AT [facility] Provider Note   CSN: [id number] Arrival date & time: 12/19/23  4401     History  Chief Complaint  Patient presents with   Shortness of Breath    Anna Holt is a 23 y.o. female.  Patient is a 23 year old female with a history of hypertension currently on metoprolol who is presenting today with complaints of URI symptoms and shortness of breath.  Patient reports that the scratchy uncomfortable throat started last week and she took a course of antibiotics just in case it was something bacterial and reports it did kind of get better but today she woke up and was feeling worse.  She reports that since last week she has had intermittent hoarse voice but this morning when she woke up she had a ton of mucus that she had a cough up and was feeling a little bit short of breath.  Feeling a little bit better now that she has woke up and started moving around.  She denies any wheezing.  No history of asthma or use of inhalers in the past.  She is not a smoker.  She did report some pain when taking a deep breath but no difficulty swallowing.  Minimal nasal congestion.  The history is provided by the patient.  Shortness of Breath      Home Medications Prior to Admission medications   Medication Sig Start Date End Date Taking? Authorizing Provider  NIFEdipine  (PROCARDIA -XL/NIFEDICAL-XL) 30 MG 24 hr tablet Take 1 tablet (30 mg total) by mouth daily. 11/03/23   Walker, Caitlin S, NP      Allergies    Lisinopril and Norethindrone    Review of Systems   Review of Systems  Respiratory:  Positive for shortness of breath.     Physical Exam Updated Vital Signs BP (!) 168/123   Pulse 96   Temp 97.7 F (36.5 C) (Oral)   Resp 20   Ht 5\' 7"  (1.702 m)   Wt 89.8 kg   LMP 12/02/2023 (Approximate)   SpO2 100%   BMI 31.01 kg/m  Physical Exam Vitals and nursing note reviewed.  Constitutional:      General: She is not in acute  distress.    Appearance: She is well-developed.  HENT:     Head: Normocephalic and atraumatic.     Right Ear: Tympanic membrane normal.     Left Ear: Tympanic membrane normal.     Nose: Congestion present.     Mouth/Throat:     Mouth: Mucous membranes are moist.     Pharynx: Posterior oropharyngeal erythema present. No oropharyngeal exudate.  Eyes:     Pupils: Pupils are equal, round, and reactive to light.  Neck:     Comments: Hoarse voice but no stridor Cardiovascular:     Rate and Rhythm: Normal rate and regular rhythm.     Heart sounds: Normal heart sounds. No murmur heard.    No friction rub.  Pulmonary:     Effort: Pulmonary effort is normal.     Breath sounds: Normal breath sounds. No wheezing or rales.  Musculoskeletal:        General: No tenderness. Normal range of motion.     Comments: No edema  Skin:    General: Skin is warm and dry.     Findings: No rash.  Neurological:     Mental Status: She is alert and oriented to person, place, and time.     Cranial Nerves: No cranial nerve  deficit.  Psychiatric:        Behavior: Behavior normal.     ED Results / Procedures / Treatments   Labs (all labs ordered are listed, but only abnormal results are displayed) Labs Reviewed - No data to display  EKG None  Radiology DG Chest [facility] 1 View Result Date: 12/19/2023 CLINICAL DATA:  Shortness of breath EXAM: PORTABLE CHEST 1 VIEW COMPARISON:  None. FINDINGS: The heart size and mediastinal contours are within normal limits. Both lungs are clear. The visualized skeletal structures are unremarkable. IMPRESSION: No active disease.  No focal infiltrate. Electronically Signed   By: Reagan Camera M.D.   On: 12/19/2023 09:50    Procedures Procedures    Medications Ordered in ED Medications - No data to display  ED Course/ Medical Decision Making/ A&P                                 Medical Decision Making Amount and/or Complexity of Data Reviewed Radiology: ordered and  independent interpretation performed. Decision-making details documented in ED Course.   Pt with symptoms consistent with viral URI/laryngitis.  Well appearing here.  No signs of breathing difficulty or wheezing.  She has no stridor and no findings to suggest epiglottitis, RPA or PTA.  Some erythema in posterior pharynx which could be a viral pharyngitis, but no signs of of otitis or abnormal abdominal findings.  Pt is hypertensive here with hx of HTN and on metoprolol which she took today.  Was recently seen in cardiology advanced htn clinic and had been changed to nifedipine  but she states she has been scared to start it so has continued with metoprolol.  Low suspicion for cardiac cause today.  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 I have independently visualized and interpreted pt's images today. CXR wnl and pt to return with any further problems.         Final Clinical Impression(s) / ED Diagnoses Final diagnoses:  Viral URI with cough    Rx / DC Orders ED Discharge Orders     None         Almond Army, MD 12/19/23 [phone]

## 2024-01-19 ENCOUNTER — Encounter (HOSPITAL_BASED_OUTPATIENT_CLINIC_OR_DEPARTMENT_OTHER): Payer: Self-pay | Admitting: Family

## 2024-01-19 ENCOUNTER — Ambulatory Visit (HOSPITAL_BASED_OUTPATIENT_CLINIC_OR_DEPARTMENT_OTHER): Admitting: Family

## 2024-01-19 VITALS — BP 162/92 | HR 83 | Ht 67.0 in | Wt 205.0 lb

## 2024-01-19 DIAGNOSIS — I10 Essential (primary) hypertension: Secondary | ICD-10-CM | POA: Diagnosis not present

## 2024-01-19 NOTE — Progress Notes (Signed)
 Advanced Hypertension Clinic Assessment:    Date:  01/21/2024   ID:  Anna Holt, DOB 2001/07/19, MRN [id number]  PCP:  Leonel Cole, MD  Cardiologist:  Annabella Scarce, MD  Nephrologist:  Referring MD: Leonel Cole, MD   CC: Hypertension  History of Present Illness:    Anna Holt is a 23 y.o. female with a hx of hypertension here to follow up in the Advanced Hypertension Clinic.   Established with Advanced Hypertension Clinic 11/03/23. Diagnosis of HTN at 23 yo. Family  history with HTN in her mother and maternal grandfather. She was exercising multiple times per week with cardio and weight lifting and not routinely following a low sodium diet. No tobacco use, no longer smoking THC, and social alcohol use. Previously did not tolerate Lisinopril with dizziness, nausea. Was feeling poorly on even reduced dose Metoprolol with feeling sluggish. Metanephrines, catecholamines, renin-aldosterone ordered but not collected. She had sleep study with [facility] and was awaiting results. She was recommended to stop Metoprolol and start Nifedipine .  Presents today for follow up independently. 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 She did not yet start Nifedipine  as was nervous about starting new medication. Reviewed efficacy and safety. She notes inconsistently in her diet, often eating out high-sodium foods, and has not been exercising regularly over the last 1-2 months. She has BP cuff at home but has not been checking.   Previous antihypertensives: Lisinopril - dizziness, nausea Metoprolol   Past Medical History:  Diagnosis Date   Hypertension     Past Surgical History:  Procedure Laterality Date   NO PAST SURGERIES      Current Medications: Current Meds  Medication Sig   metoprolol succinate (TOPROL-XL) 50 MG 24 hr tablet Take 50 mg by mouth daily.     Allergies:   Lisinopril and Norethindrone   Social  History   Socioeconomic History   Marital status: Single    Spouse name: Not on file   Number of children: Not on file   Years of education: Not on file   Highest education level: Not on file  Occupational History   Not on file  Tobacco Use   Smoking status: Never   Smokeless tobacco: Never  Substance and Sexual Activity   Alcohol use: Never   Drug use: Never   Sexual activity: Not on file  Other Topics Concern   Not on file  Social History Narrative   Not on file   Social Drivers of Health   Financial Resource Strain: Not on file  Food Insecurity: No Food Insecurity (11/03/2023)   Hunger Vital Sign    Worried About Running Out of Food in the Last Year: Never true    Ran Out of Food in the Last Year: Never true  Transportation Needs: No Transportation Needs (11/03/2023)   PRAPARE - Administrator, Civil Service (Medical): No    Lack of Transportation (Non-Medical): No  Physical Activity: Sufficiently Active (11/03/2023)   Exercise Vital Sign    Days of Exercise per Week: 5 days    Minutes of Exercise per Session: 60 min  Stress: Not on file  Social Connections: Not on file    Family History: The patient's family history includes Hypertension in her maternal grandfather and mother.  ROS:   Please see the history of present illness.     All other systems reviewed and are negative.  EKGs/Labs/Other Studies Reviewed:  Recent Labs: 07/27/2023: ALT 11; BUN 11; Creatinine, Ser 0.80; Hemoglobin 14.6; Platelets 236; Potassium 3.6; Sodium 135   Recent Lipid Panel No results found for: CHOL, TRIG, HDL, CHOLHDL, VLDL, LDLCALC, LDLDIRECT  Physical Exam:   VS:  BP (!) 162/92   Pulse 83   Ht 5' 7 (1.702 m)   Wt 205 lb (93 kg)   SpO2 99%   BMI 32.11 kg/m  , BMI Body mass index is 32.11 kg/m.  Vitals:   01/19/24 0954 01/19/24 1020  BP: (!) 180/120 (!) 162/92  Pulse: 83   Height: 5' 7 (1.702 m)   Weight: 205 lb (93 kg)   SpO2: 99%    BMI (Calculated): 32.1     GENERAL:  Well appearing HEENT: Pupils equal round and reactive, fundi not visualized, oral mucosa unremarkable NECK:  No jugular venous distention, waveform within normal limits, carotid upstroke brisk and symmetric, no bruits, no thyromegaly LYMPHATICS:  No cervical adenopathy LUNGS:  Clear to auscultation bilaterally HEART:  RRR.  PMI not displaced or sustained,S1 and S2 within normal limits, no S3, no S4, no clicks, no rubs, no  murmurs ABD:  Flat, positive bowel sounds normal in frequency in pitch, no bruits, no rebound, no guarding, no midline pulsatile mass, no hepatomegaly, no splenomegaly EXT:  2 plus pulses throughout, no edema, no cyanosis no clubbing SKIN:  No rashes no nodules NEURO:  Cranial nerves II through XII grossly intact, motor grossly intact throughout PSYCH:  Cognitively intact, oriented to person place and time   ASSESSMENT/PLAN:    HTN - BP not at goal <130/80. Initially 180/120 with repeat 162/92. Has not been checking at home recently but has a cuff. Notes inconsistency in exercise and not adhering to low sodium diet. Also recent stressors studying for CNA exam, upcoming move. She di dnot yet switch from Metoprolol to Nifedipine . Benefits and safety reviewed. She prefers to wait until after her upcoming move to transition medications.  In 2 weeks, stop Metoprolol and start NIfedipine  30mg  daily (consider up-titrating at follow up) Discussed to monitor BP at home at least 2 hours after medications and sitting for 5-10 minutes.   Screening for Secondary Hypertension:     11/03/2023   11:52 AM  Causes  Drugs/Herbals Screened     - Comments no OTC agents, limits caffeine  Renovascular HTN Screened     - Comments 2021 normal renal arteries by duplex  Sleep Apnea Screened     - Comments 10/2023 recently completed sleep study, await results from [facility]  Thyroid Disease Screened     - Comments normal TSH   Hyperaldosteronism Screened     - Comments 11/03/23 renin-aldosterone ordered  Pheochromocytoma Screened     - Comments 11/03/23 metanephrines, catecholamines ordered  Cushing's Syndrome N/A     - Comments non cushingoid appearance  Coarctation of the Aorta N/A     - Comments BP symmetrical  Compliance Screened     - Comments Taking medication routinely    Relevant Labs/Studies:    Latest Ref Rng & Units 07/27/2023   12:51 PM 07/27/2023   12:39 PM  Basic Labs  Sodium 135 - 145 mmol/L 135  135   Potassium 3.5 - 5.1 mmol/L 3.6  3.7   Creatinine 0.44 - 1.00 mg/dL 9.19  9.18                     Disposition:    FU with MD/APP/PharmD in 6 weeks  Medication Adjustments/Labs and Tests Ordered: Current medicines are reviewed at length with the patient today.  Concerns regarding medicines are outlined above.  No orders of the defined types were placed in this encounter.  No orders of the defined types were placed in this encounter.    Signed, Reche GORMAN Finder, NP  01/21/2024 2:31 PM    [facility]

## 2024-01-19 NOTE — Patient Instructions (Signed)
 Medication Instructions:  Your physician has recommended you make the following change in your medication:  Start in 2 weeks- Nifedipine  1/2 tablet each evening for 3 days then move up to whole tablet each evening- when you start this stop metoprolol     Follow-Up: Please follow up in 6 weeks  in [facility] with Dr. Raford, Reche Finder, NP or Allean Mink PharmD

## 2024-02-26 NOTE — Progress Notes (Deleted)
 Office Visit    Patient Name: Anna Holt Date of Encounter: 02/26/2024  Primary Care Provider:  Leonel Cole, MD Primary Cardiologist:  Annabella Scarce, MD  Chief Complaint    Hypertension - Advanced hypertension clinic  Past Medical History   No other significant cardiac history   Allergies  Allergen Reactions   Lisinopril Nausea And Vomiting   Norethindrone Itching    Itchy hands and feet    History of Present Illness    Anna Holt is a 23 y.o. female patient who was referred to the Advanced Hypertension Clinic by Dr. Cole Leonel MD.  Anna Holt was first diagnosed with hypertension 3 years ago, at the age of 50.  When she saw [facility] in June, her pressure was initally 180/120, but dropped to 162/92 before the appointment was over.  She was switched from metoprolol to nifedipine  30 mg and asked to check home readings.    Blood Pressure Goal:  130/80  Current Medications: nifedipine  xl 30 mg daily  Adherence Assessment  Do you ever forget to take your medication? [] Yes [] No  Do you ever skip doses due to side effects? [] Yes [] No  Do you have trouble affording your medicines? [] Yes [] No  Are you ever unable to pick up your medication due to transportation difficulties? [] Yes [] No  Do you ever stop taking your medications because you don't believe they are helping? [] Yes [] No  Do you check your weight daily? [] Yes [] No   Adherence strategy: ***  Barriers to obtaining medications: ***  Previously tried:   lisinopril - nausea/vomiting  Family Hx:   mother, maternal grandfather both with hypertension  Social Hx:      Tobacco: no  Alcohol:  social  Caffeine:    Diet:      Exercise:   Home BP readings:       Accessory Clinical Findings    Lab Results  Component Value Date   CREATININE 0.80 07/27/2023   BUN 11 07/27/2023   NA 135 07/27/2023   K 3.6 07/27/2023   CL 102 07/27/2023   CO2 21 (L) 07/27/2023   Lab Results  Component  Value Date   ALT 11 07/27/2023   AST 18 07/27/2023   ALKPHOS 56 07/27/2023   BILITOT 0.2 07/27/2023   No results found for: HGBA1C  Screening for Secondary Hypertension: { Click here to document screening for secondary causes of HTN  :1}     11/03/2023   11:52 AM  Causes  Drugs/Herbals Screened     - Comments no OTC agents, limits caffeine  Renovascular HTN Screened     - Comments 2021 normal renal arteries by duplex  Sleep Apnea Screened     - Comments 10/2023 recently completed sleep study, await results from [facility]  Thyroid Disease Screened     - Comments normal TSH  Hyperaldosteronism Screened     - Comments 11/03/23 renin-aldosterone ordered  Pheochromocytoma Screened     - Comments 11/03/23 metanephrines, catecholamines ordered  Cushing's Syndrome N/A     - Comments non cushingoid appearance  Coarctation of the Aorta N/A     - Comments BP symmetrical  Compliance Screened     - Comments Taking medication routinely    Relevant Labs/Studies:    Latest Ref Rng & Units 07/27/2023   12:51 PM 07/27/2023   12:39 PM  Basic Labs  Sodium 135 - 145 mmol/L 135  135   Potassium 3.5 - 5.1 mmol/L 3.6  3.7  Creatinine 0.44 - 1.00 mg/dL 9.19  9.18                      Home Medications    Current Outpatient Medications  Medication Sig Dispense Refill   metoprolol succinate (TOPROL-XL) 50 MG 24 hr tablet Take 50 mg by mouth daily.     NIFEdipine  (PROCARDIA -XL/NIFEDICAL-XL) 30 MG 24 hr tablet Take 1 tablet (30 mg total) by mouth daily. (Patient not taking: Reported on 01/19/2024) 90 tablet 1   No current facility-administered medications for this visit.     Assessment & Plan   No BP recorded.  {Refresh Note OR Click here to enter BP  :1}***   No problem-specific Assessment & Plan notes found for this encounter.   Agapita Savarino PharmD CPP [facility]  [address] [place], [place] [address] [phone]

## 2024-02-27 ENCOUNTER — Ambulatory Visit (HOSPITAL_BASED_OUTPATIENT_CLINIC_OR_DEPARTMENT_OTHER)

## 2024-04-19 ENCOUNTER — Encounter (HOSPITAL_COMMUNITY): Payer: Self-pay

## 2024-04-19 ENCOUNTER — Inpatient Hospital Stay (HOSPITAL_COMMUNITY)
Admission: AD | Admit: 2024-04-19 | Discharge: 2024-04-20 | Disposition: A | Discharge status: Home / Self Care | Attending: Obstetrics & Gynecology | Admitting: Obstetrics & Gynecology

## 2024-04-19 DIAGNOSIS — O10911 Unspecified pre-existing hypertension complicating pregnancy, first trimester: Secondary | ICD-10-CM

## 2024-04-19 DIAGNOSIS — O10011 Pre-existing essential hypertension complicating pregnancy, first trimester: Secondary | ICD-10-CM | POA: Insufficient documentation

## 2024-04-19 DIAGNOSIS — R9431 Abnormal electrocardiogram [ECG] [EKG]: Secondary | ICD-10-CM | POA: Insufficient documentation

## 2024-04-19 DIAGNOSIS — Z3A08 8 weeks gestation of pregnancy: Secondary | ICD-10-CM | POA: Insufficient documentation

## 2024-04-19 DIAGNOSIS — O98511 Other viral diseases complicating pregnancy, first trimester: Secondary | ICD-10-CM | POA: Insufficient documentation

## 2024-04-19 DIAGNOSIS — U071 COVID-19: Secondary | ICD-10-CM | POA: Insufficient documentation

## 2024-04-19 DIAGNOSIS — R52 Pain, unspecified: Secondary | ICD-10-CM | POA: Insufficient documentation

## 2024-04-19 NOTE — MAU Note (Addendum)
 MAU Triage Note: Anna Holt is a 23 y.o. at Unknown here in MAU reporting: heavy feeling on her chest and a dry, scratchy throat. She tested negative for COVID the past two days. Denies cough, but does report initiating cough to get some of the mucus up out of her chest. Denies VB or watery LOF.   She reports that she does have CHTN and takes 50 mg metoprolol. Last took at 1000 this AM. Usually runs 140's/80's.  Patient complaint: chest pain and throat irritation        Onset of complaint: Monday or Tuesday LMP: Patient's last menstrual period was 12/02/2023 (approximate).  Vitals:   04/19/24 2353  BP: (!) 195/126  Pulse: 91  Resp: 17  Temp: 99.4 F (37.4 C)  SpO2: 100%     Lab orders placed from triage: UA, EKG

## 2024-04-20 ENCOUNTER — Encounter (HOSPITAL_COMMUNITY): Payer: Self-pay | Admitting: Obstetrics and Gynecology

## 2024-04-20 ENCOUNTER — Other Ambulatory Visit: Payer: Self-pay

## 2024-04-20 DIAGNOSIS — Z3A08 8 weeks gestation of pregnancy: Secondary | ICD-10-CM

## 2024-04-20 DIAGNOSIS — R9431 Abnormal electrocardiogram [ECG] [EKG]: Secondary | ICD-10-CM

## 2024-04-20 DIAGNOSIS — O98511 Other viral diseases complicating pregnancy, first trimester: Secondary | ICD-10-CM

## 2024-04-20 DIAGNOSIS — O10911 Unspecified pre-existing hypertension complicating pregnancy, first trimester: Secondary | ICD-10-CM

## 2024-04-20 DIAGNOSIS — U071 COVID-19: Secondary | ICD-10-CM

## 2024-04-20 DIAGNOSIS — R52 Pain, unspecified: Secondary | ICD-10-CM

## 2024-04-20 DIAGNOSIS — R0789 Other chest pain: Secondary | ICD-10-CM | POA: Diagnosis present

## 2024-04-20 DIAGNOSIS — J029 Acute pharyngitis, unspecified: Secondary | ICD-10-CM | POA: Diagnosis present

## 2024-04-20 DIAGNOSIS — O10011 Pre-existing essential hypertension complicating pregnancy, first trimester: Secondary | ICD-10-CM | POA: Diagnosis not present

## 2024-04-20 LAB — URINALYSIS, ROUTINE W REFLEX MICROSCOPIC
Bilirubin Urine: NEGATIVE
Glucose, UA: NEGATIVE mg/dL
Hgb urine dipstick: NEGATIVE
Ketones, ur: NEGATIVE mg/dL
Leukocytes,Ua: NEGATIVE
Nitrite: NEGATIVE
Protein, ur: NEGATIVE mg/dL
Specific Gravity, Urine: 1.019 (ref 1.005–1.030)
pH: 6 (ref 5.0–8.0)

## 2024-04-20 LAB — RESP PANEL BY RT-PCR (RSV, FLU A&B, COVID)  RVPGX2
Influenza A by PCR: NEGATIVE
Influenza B by PCR: NEGATIVE
Resp Syncytial Virus by PCR: NEGATIVE
SARS Coronavirus 2 by RT PCR: POSITIVE — AB

## 2024-04-20 LAB — COMPREHENSIVE METABOLIC PANEL WITH GFR
ALT: 15 U/L (ref 0–44)
AST: 15 U/L (ref 15–41)
Albumin: 3.6 g/dL (ref 3.5–5.0)
Alkaline Phosphatase: 59 U/L (ref 38–126)
Anion gap: 11 (ref 5–15)
BUN: 6 mg/dL (ref 6–20)
CO2: 21 mmol/L — ABNORMAL LOW (ref 22–32)
Calcium: 8.4 mg/dL — ABNORMAL LOW (ref 8.9–10.3)
Chloride: 101 mmol/L (ref 98–111)
Creatinine, Ser: 0.58 mg/dL (ref 0.44–1.00)
GFR, Estimated: >60 mL/min (ref >60)
Glucose, Bld: 93 mg/dL (ref 70–99)
Potassium: 3.5 mmol/L (ref 3.5–5.1)
Sodium: 133 mmol/L — ABNORMAL LOW (ref 135–145)
Total Bilirubin: 0.6 mg/dL (ref 0.0–1.2)
Total Protein: 6.9 g/dL (ref 6.5–8.1)

## 2024-04-20 LAB — CBC
HCT: 36.2 % (ref 36.0–46.0)
Hemoglobin: 12.3 g/dL (ref 12.0–15.0)
MCH: 27.8 pg (ref 26.0–34.0)
MCHC: 34 g/dL (ref 30.0–36.0)
MCV: 81.9 fL (ref 80.0–100.0)
Platelets: 305 K/uL (ref 150–400)
RBC: 4.42 MIL/uL (ref 3.87–5.11)
RDW: 12.5 % (ref 11.5–15.5)
WBC: 16.4 K/uL — ABNORMAL HIGH (ref 4.0–10.5)
nRBC: 0 % (ref 0.0–0.2)

## 2024-04-20 LAB — TROPONIN I (HIGH SENSITIVITY): Troponin I (High Sensitivity): 3 ng/L (ref <18)

## 2024-04-20 MED ORDER — HYDRALAZINE HCL 20 MG/ML IJ SOLN
10.0000 mg | INTRAMUSCULAR | Status: DC | PRN
  Filled 2024-04-20: qty 1

## 2024-04-20 MED ORDER — LABETALOL HCL 5 MG/ML IV SOLN
20.0000 mg | INTRAVENOUS | Status: DC | PRN
  Administered 2024-04-20: 20 mg via INTRAVENOUS
  Filled 2024-04-20: qty 4

## 2024-04-20 MED ORDER — LABETALOL HCL 5 MG/ML IV SOLN
40.0000 mg | INTRAVENOUS | Status: DC | PRN
  Administered 2024-04-20: 40 mg via INTRAVENOUS
  Filled 2024-04-20: qty 8

## 2024-04-20 MED ORDER — LABETALOL HCL 5 MG/ML IV SOLN
80.0000 mg | INTRAVENOUS | Status: DC | PRN
  Administered 2024-04-20: 80 mg via INTRAVENOUS
  Filled 2024-04-20: qty 16

## 2024-04-20 MED ORDER — LABETALOL HCL 200 MG PO TABS: 400.0000 mg | ORAL_TABLET | Freq: Two times a day (BID) | ORAL | 5 refills | Status: DC

## 2024-04-20 NOTE — MAU Provider Note (Signed)
 Chief Complaint: Chest Pain, dry throat, and Nasal Congestion   Event Date/Time   First Provider Initiated Contact with Patient 04/20/24 0024      SUBJECTIVE HPI: Anna Holt is a 23 y.o. G1P0 at [redacted]w[redacted]d by LMP who presents to maternity admissions reporting scratchy throat and pain in her chest that starts in her right upper back and radiates to the chest. She also reports nasal congestion and recent exposure at work to [employer].  She has taken COVID tests this week, which have been negative. She denies vaginal bleeding, vaginal itching/burning, urinary symptoms, h/a, dizziness, n/v, or fever/chills.       HPI  Past Medical History:  Diagnosis Date   Hypertension    Past Surgical History:  Procedure Laterality Date   ADENOIDECTOMY     TONSILLECTOMY     WISDOM TOOTH EXTRACTION     Social History   Socioeconomic History   Marital status: Single    Spouse name: Not on file   Number of children: Not on file   Years of education: Not on file   Highest education level: Not on file  Occupational History   Not on file  Tobacco Use   Smoking status: Never   Smokeless tobacco: Never  Substance and Sexual Activity   Alcohol use: Never   Drug use: Never   Sexual activity: Not on file  Other Topics Concern   Not on file  Social History Narrative   Not on file   Social Drivers of Health   Financial Resource Strain: Not on file  Food Insecurity: No Food Insecurity (11/03/2023)   Hunger Vital Sign    Worried About Running Out of Food in the Last Year: Never true    Ran Out of Food in the Last Year: Never true  Transportation Needs: No Transportation Needs (11/03/2023)   PRAPARE - Administrator, Civil Service (Medical): No    Lack of Transportation (Non-Medical): No  Physical Activity: Sufficiently Active (11/03/2023)   Exercise Vital Sign    Days of Exercise per Week: 5 days    Minutes of Exercise per Session: 60 min  Stress: Not on file  Social Connections:  Not on file  Intimate Partner Violence: Not on file   No current facility-administered medications on file prior to encounter.   Current Outpatient Medications on File Prior to Encounter  Medication Sig Dispense Refill   metoprolol succinate (TOPROL-XL) 50 MG 24 hr tablet Take 50 mg by mouth daily.     NIFEdipine  (PROCARDIA -XL/NIFEDICAL-XL) 30 MG 24 hr tablet Take 1 tablet (30 mg total) by mouth daily. (Patient not taking: Reported on 01/19/2024) 90 tablet 1   Allergies  Allergen Reactions   Lisinopril Nausea And Vomiting   Norethindrone Itching    Itchy hands and feet    ROS:  Review of Systems  Constitutional:  Negative for chills, fatigue and fever.  Eyes:  Negative for visual disturbance.  Respiratory:  Negative for shortness of breath.   Cardiovascular:  Positive for chest pain.  Gastrointestinal:  Negative for abdominal pain, nausea and vomiting.  Genitourinary:  Negative for difficulty urinating, dysuria, flank pain, pelvic pain, vaginal bleeding, vaginal discharge and vaginal pain.  Neurological:  Negative for dizziness and headaches.  Psychiatric/Behavioral: Negative.       I have reviewed patient's Past Medical Hx, Surgical Hx, Family Hx, Social Hx, medications and allergies.   Physical Exam  Patient Vitals for the past 24 hrs:  BP Temp Temp src Pulse Resp  SpO2 Height Weight  04/20/24 0442 (!) 161/76 -- -- (!) 104 -- -- -- --  04/20/24 0330 (!) 164/85 -- -- (!) 106 -- 100 % -- --  04/20/24 0315 (!) 160/88 -- -- (!) 106 -- 100 % -- --  04/20/24 0305 (!) 156/81 -- -- (!) 105 -- 100 % -- --  04/20/24 0300 (!) 176/94 -- -- (!) 102 -- 100 % -- --  04/20/24 0245 (!) 169/97 -- -- (!) 106 -- 100 % -- --  04/20/24 0240 (!) 162/76 -- -- (!) 105 -- 100 % -- --  04/20/24 0215 (!) 175/92 -- -- (!) 101 -- 100 % -- --  04/20/24 0145 (!) 175/92 -- -- (!) 104 -- 100 % -- --  04/20/24 0132 (!) 201/126 -- -- (!) 117 -- -- -- --  04/20/24 0100 (!) 199/129 -- -- (!) 118 -- 100 % --  --  04/20/24 0051 (!) 215/158 -- -- (!) 109 -- 100 % -- --  04/20/24 0029 (!) 210/134 -- -- (!) 126 -- 100 % -- --  04/20/24 0001 (!) 214/130 -- -- (!) 115 -- -- -- --  04/19/24 2353 (!) 195/126 99.4 F (37.4 C) Oral 91 17 100 % 5' 8 (1.727 m) 92.6 kg   Constitutional: Well-developed, well-nourished female in no acute distress.  HEART: normal rate, heart sounds, regular rhythm RESP: normal effort, lung sounds clear and equal bilaterally  GI: Abd soft, non-tender. Pos BS x 4 MS: Extremities nontender, no edema, normal ROM Neurologic: Alert and oriented x 4.  GU: Neg CVAT.  PELVIC EXAM: deferred   LAB RESULTS Results for orders placed or performed during the hospital encounter of 04/19/24 (from the past 24 hours)  Urinalysis, Routine w reflex microscopic -Urine, Clean Catch     Status: Abnormal   Collection Time: 04/20/24 12:00 AM  Result Value Ref Range   Color, Urine YELLOW YELLOW   APPearance HAZY (A) CLEAR   Specific Gravity, Urine 1.019 1.005 - 1.030   pH 6.0 5.0 - 8.0   Glucose, UA NEGATIVE NEGATIVE mg/dL   Hgb urine dipstick NEGATIVE NEGATIVE   Bilirubin Urine NEGATIVE NEGATIVE   Ketones, ur NEGATIVE NEGATIVE mg/dL   Protein, ur NEGATIVE NEGATIVE mg/dL   Nitrite NEGATIVE NEGATIVE   Leukocytes,Ua NEGATIVE NEGATIVE  Resp panel by RT-PCR (RSV, Flu A&B, Covid)     Status: Abnormal   Collection Time: 04/20/24 12:30 AM   Specimen: Nasal Swab  Result Value Ref Range   SARS Coronavirus 2 by RT PCR POSITIVE (A) NEGATIVE   Influenza A by PCR NEGATIVE NEGATIVE   Influenza B by PCR NEGATIVE NEGATIVE   Resp Syncytial Virus by PCR NEGATIVE NEGATIVE  CBC     Status: Abnormal   Collection Time: 04/20/24  1:30 AM  Result Value Ref Range   WBC 16.4 (H) 4.0 - 10.5 K/uL   RBC 4.42 3.87 - 5.11 MIL/uL   Hemoglobin 12.3 12.0 - 15.0 g/dL   HCT 63.7 63.9 - 53.9 %   MCV 81.9 80.0 - 100.0 fL   MCH 27.8 26.0 - 34.0 pg   MCHC 34.0 30.0 - 36.0 g/dL   RDW 87.4 88.4 - 84.4 %   Platelets  305 150 - 400 K/uL   nRBC 0.0 0.0 - 0.2 %  Comprehensive metabolic panel     Status: Abnormal   Collection Time: 04/20/24  1:30 AM  Result Value Ref Range   Sodium 133 (L) 135 - 145 mmol/L   Potassium  3.5 3.5 - 5.1 mmol/L   Chloride 101 98 - 111 mmol/L   CO2 21 (L) 22 - 32 mmol/L   Glucose, Bld 93 70 - 99 mg/dL   BUN 6 6 - 20 mg/dL   Creatinine, Ser 9.41 0.44 - 1.00 mg/dL   Calcium 8.4 (L) 8.9 - 10.3 mg/dL   Total Protein 6.9 6.5 - 8.1 g/dL   Albumin 3.6 3.5 - 5.0 g/dL   AST 15 15 - 41 U/L   ALT 15 0 - 44 U/L   Alkaline Phosphatase 59 38 - 126 U/L   Total Bilirubin 0.6 0.0 - 1.2 mg/dL   GFR, Estimated >39 >39 mL/min   Anion gap 11 5 - 15  Troponin I (High Sensitivity)     Status: None   Collection Time: 04/20/24  1:30 AM  Result Value Ref Range   Troponin I (High Sensitivity) 3 <18 ng/L       IMAGING No results found.  MAU Management/MDM: Orders Placed This Encounter  Procedures   Resp panel by RT-PCR (RSV, Flu A&B, Covid) Anterior Nasal Swab   Urinalysis, Routine w reflex microscopic -Urine, Clean Catch   CBC   Comprehensive metabolic panel   Notify physician (specify) Confirmatory reading of BP> 160/110 15 minutes later   Apply Hypertensive Disorders of Pregnancy Care Plan   Measure blood pressure   Airborne and Contact precautions   ED EKG   Discharge patient Discharge disposition: 01-Home or Self Care; Discharge patient date: 04/20/2024    Meds ordered this encounter  Medications   AND Linked Order Group    labetalol  (NORMODYNE ) injection 20 mg    labetalol  (NORMODYNE ) injection 40 mg    labetalol  (NORMODYNE ) injection 80 mg    hydrALAZINE  (APRESOLINE ) injection 10 mg   labetalol  (NORMODYNE ) 200 MG tablet    Sig: Take 2 tablets (400 mg total) by mouth 2 (two) times daily.    Dispense:  120 tablet    Refill:  5    Supervising Provider:   JAYNE MINDER H [[id number]]    EKG with nonspecific changes, report scanned into EPIC after reviewed by family medicine  doctor.  Pt Covid positive.  Offered Paxlovid, pt declined.  Note provided for pt to be out of work x 5 days.  Return to MAU as needed for emergencies. Keep scheduled appts in the office.    ASSESSMENT 1. COVID-19 affecting pregnancy in first trimester   2. [redacted] weeks gestation of pregnancy   3. Body aches   4. Chronic hypertension in obstetric context in first trimester   5. Abnormal EKG     PLAN Discharge home Allergies as of 04/20/2024       Reactions   Lisinopril Nausea And Vomiting   Norethindrone Itching   Itchy hands and feet        Medication List     STOP taking these medications    metoprolol succinate 50 MG 24 hr tablet Commonly known as: TOPROL-XL   NIFEdipine  30 MG 24 hr tablet Commonly known as: PROCARDIA -XL/NIFEDICAL-XL       TAKE these medications    labetalol  200 MG tablet Commonly known as: NORMODYNE  Take 2 tablets (400 mg total) by mouth 2 (two) times daily.        Follow-up Information     Gynecology, [facility] Follow up.   Specialty: Obstetrics and Gynecology Why: As scheduled Contact information: [address] [place] [address] [phone]  [facility] Follow up.   Specialty: Obstetrics and Gynecology Why: As needed for emergencies Contact information: [address] [place]  [address] [phone]                Olam Boards Certified Nurse-Midwife 04/20/2024  4:53 AM

## 2024-05-21 ENCOUNTER — Encounter (HOSPITAL_BASED_OUTPATIENT_CLINIC_OR_DEPARTMENT_OTHER): Payer: Self-pay

## 2024-05-24 ENCOUNTER — Ambulatory Visit (HOSPITAL_BASED_OUTPATIENT_CLINIC_OR_DEPARTMENT_OTHER): Admitting: Family

## 2024-05-24 ENCOUNTER — Encounter (HOSPITAL_BASED_OUTPATIENT_CLINIC_OR_DEPARTMENT_OTHER): Payer: Self-pay | Admitting: Family

## 2024-05-24 VITALS — BP 180/100 | HR 83 | Ht 67.0 in | Wt 196.8 lb

## 2024-05-24 DIAGNOSIS — I10 Essential (primary) hypertension: Secondary | ICD-10-CM

## 2024-05-24 DIAGNOSIS — Z3A13 13 weeks gestation of pregnancy: Secondary | ICD-10-CM

## 2024-05-24 MED ORDER — LABETALOL HCL 200 MG PO TABS: 600.0000 mg | ORAL_TABLET | Freq: Two times a day (BID) | ORAL | 2 refills | Status: DC

## 2024-05-24 NOTE — Progress Notes (Signed)
 Advanced Hypertension Clinic Assessment:    Date:  05/24/2024   ID:  Anna Holt, DOB 02-Jul-2001, MRN [id number]  PCP:  Leonel Cole, MD  Cardiologist:  Annabella Scarce, MD  Nephrologist:  Referring MD: Leonel Cole, MD   CC: Hypertension  History of Present Illness:    Anna Holt is a 23 y.o. female with a hx of hypertension here to follow up in the Advanced Hypertension Clinic.   Established with Advanced Hypertension Clinic 11/03/23. Diagnosis of HTN at 23 yo. Family  history with HTN in her mother and maternal grandfather. She was exercising multiple times per week with cardio and weight lifting and not routinely following a low sodium diet. No tobacco use, no longer smoking THC, and social alcohol use. Previously did not tolerate Lisinopril with dizziness, nausea. Was feeling poorly on even reduced dose Metoprolol with feeling sluggish. Metanephrines, catecholamines, renin-aldosterone ordered but not collected. She had sleep study with [facility] and was awaiting results. She was recommended to stop Metoprolol and start Nifedipine .  At visit 01/19/24 BP not at goal. She had not transitioned from Metoprolol to Nifedipine  and was encouraged to do so.   She is presently [redacted] weeks pregnant. ED visit 04/19/24 for COVID 19 at which time metoprolol changed to labetolol. Reports no shortness of breath nor dyspnea on exertion. Reports no chest pain, pressure, or tightness. No edema, orthopnea, PND. Reports no palpitations.  Reports BP at home such as 160/90. Interestingly at [facility] yesterday reports BP 118/62 with no associated lightheadedness. Discussed the importance of BP control in pregnancy.   Previous antihypertensives: Lisinopril - dizziness, nausea Metoprolol  Past Medical History:  Diagnosis Date   Hypertension     Past Surgical History:  Procedure Laterality Date   ADENOIDECTOMY     TONSILLECTOMY     WISDOM TOOTH EXTRACTION     Current Medications: Current Meds   Medication Sig   Prenatal Multivit-Min-Fe-FA (PRE-NATAL PO) Take 2 tablets by mouth daily at 6 (six) AM.   triamcinolone cream (KENALOG) 0.1 % Apply 1 Application topically 2 (two) times daily.   [DISCONTINUED] labetalol  (NORMODYNE ) 200 MG tablet Take 2 tablets (400 mg total) by mouth 2 (two) times daily.     Allergies:   Lisinopril and Norethindrone   Social History   Socioeconomic History   Marital status: Single    Spouse name: Not on file   Number of children: Not on file   Years of education: Not on file   Highest education level: Not on file  Occupational History   Not on file  Tobacco Use   Smoking status: Never   Smokeless tobacco: Never  Substance and Sexual Activity   Alcohol use: Never   Drug use: Never   Sexual activity: Not on file  Other Topics Concern   Not on file  Social History Narrative   Not on file   Social Drivers of Health   Financial Resource Strain: Not on file  Food Insecurity: No Food Insecurity (11/03/2023)   Hunger Vital Sign    Worried About Running Out of Food in the Last Year: Never true    Ran Out of Food in the Last Year: Never true  Transportation Needs: No Transportation Needs (11/03/2023)   PRAPARE - Administrator, Civil Service (Medical): No    Lack of Transportation (Non-Medical): No  Physical Activity: Sufficiently Active (11/03/2023)   Exercise Vital Sign    Days of Exercise per Week: 5 days    Minutes of  Exercise per Session: 60 min  Stress: Not on file  Social Connections: Not on file    Family History: The patient's family history includes Hypertension in her maternal grandfather and mother.  ROS:   Please see the history of present illness.     All other systems reviewed and are negative.  EKGs/Labs/Other Studies Reviewed:         Recent Labs: 04/20/2024: ALT 15; BUN 6; Creatinine, Ser 0.58; Hemoglobin 12.3; Platelets 305; Potassium 3.5; Sodium 133   Recent Lipid Panel No results found for: CHOL,  TRIG, HDL, CHOLHDL, VLDL, LDLCALC, LDLDIRECT  Physical Exam:   VS:  BP (!) 170/110 (BP Location: Left Arm, Cuff Size: Normal)   Pulse 83   Ht 5' 7 (1.702 m)   Wt 196 lb 12.8 oz (89.3 kg)   LMP 12/02/2023 (Approximate)   SpO2 96%   BMI 30.82 kg/m  , BMI Body mass index is 30.82 kg/m.  Vitals:   05/24/24 1008  BP: (!) 170/110  Pulse: 83  Height: 5' 7 (1.702 m)  Weight: 196 lb 12.8 oz (89.3 kg)  SpO2: 96%  BMI (Calculated): 30.82    GENERAL:  Well appearing HEENT: Pupils equal round and reactive, fundi not visualized, oral mucosa unremarkable NECK:  No jugular venous distention, waveform within normal limits, carotid upstroke brisk and symmetric, no bruits, no thyromegaly LYMPHATICS:  No cervical adenopathy LUNGS:  Clear to auscultation bilaterally HEART:  RRR.  PMI not displaced or sustained,S1 and S2 within normal limits, no S3, no S4, no clicks, no rubs, no  murmurs ABD:  Flat, positive bowel sounds normal in frequency in pitch, no bruits, no rebound, no guarding, no midline pulsatile mass, no hepatomegaly, no splenomegaly EXT:  2 plus pulses throughout, no edema, no cyanosis no clubbing SKIN:  No rashes no nodules NEURO:  Cranial nerves II through XII grossly intact, motor grossly intact throughout PSYCH:  Cognitively intact, oriented to person place and time   ASSESSMENT/PLAN:    HTN - BP not at goal <130/80.Presently [redacted] weeks pregnant with a baby girl. Discussed importance of BP control in pregnancy.  She remains somewhat hesitant regarding medication changes.  Increase labetalol  from 400 twice daily to 600 mg twice daily.  Prompt follow-up in 2 to 3 weeks.  If BP not at goal less than 130/80 we discussed that we will need to add nifedipine  30 mg daily as previously recommended. Discussed to monitor BP at home at least 2 hours after medications and sitting for 5-10 minutes.   Screening for Secondary Hypertension:     11/03/2023   11:52 AM  Causes   Drugs/Herbals Screened     - Comments no OTC agents, limits caffeine  Renovascular HTN Screened     - Comments 2021 normal renal arteries by duplex  Sleep Apnea Screened     - Comments 10/2023 recently completed sleep study, await results from [facility]  Thyroid Disease Screened     - Comments normal TSH  Hyperaldosteronism Screened     - Comments 11/03/23 renin-aldosterone ordered  Pheochromocytoma Screened     - Comments 11/03/23 metanephrines, catecholamines ordered  Cushing's Syndrome N/A     - Comments non cushingoid appearance  Coarctation of the Aorta N/A     - Comments BP symmetrical  Compliance Screened     - Comments Taking medication routinely    Relevant Labs/Studies:    Latest Ref Rng & Units 04/20/2024    1:30 AM 07/27/2023   12:51  PM 07/27/2023   12:39 PM  Basic Labs  Sodium 135 - 145 mmol/L 133  135  135   Potassium 3.5 - 5.1 mmol/L 3.5  3.6  3.7   Creatinine 0.44 - 1.00 mg/dL 9.41  9.19  9.18                     Disposition:    FU with MD/APP/PharmD in 6 weeks    Medication Adjustments/Labs and Tests Ordered: Current medicines are reviewed at length with the patient today.  Concerns regarding medicines are outlined above.  No orders of the defined types were placed in this encounter.  Meds ordered this encounter  Medications   labetalol  (NORMODYNE ) 200 MG tablet    Sig: Take 3 tablets (600 mg total) by mouth 2 (two) times daily.    Dispense:  180 tablet    Refill:  2    Supervising Provider:   LONNI SLAIN [[id number]]     Signed, Reche GORMAN Finder, NP  05/24/2024 10:32 AM    [facility]

## 2024-05-24 NOTE — Patient Instructions (Addendum)
 Medication Instructions:  CHANGE Labetolol to 3 tablets (600mg ) twice per day  If in 2 weeks your blood pressure at home is consistently more than 130/80, start Nifedipine  30mg  daily   Follow-Up: Please follow up in 2-3 weeks in ADV HTN CLINIC with Dr. Raford, Reche Finder, NP or Allean Mink PharmD    Special Instructions:    Check blood pressure once per day and keep a log. Please bring BP log to your next clinic visit.

## 2024-05-27 ENCOUNTER — Encounter (HOSPITAL_BASED_OUTPATIENT_CLINIC_OR_DEPARTMENT_OTHER): Payer: Self-pay

## 2024-06-05 ENCOUNTER — Encounter (HOSPITAL_BASED_OUTPATIENT_CLINIC_OR_DEPARTMENT_OTHER): Admitting: Family

## 2024-06-05 ENCOUNTER — Other Ambulatory Visit: Payer: Self-pay | Admitting: Obstetrics and Gynecology

## 2024-06-05 DIAGNOSIS — O10919 Unspecified pre-existing hypertension complicating pregnancy, unspecified trimester: Secondary | ICD-10-CM

## 2024-06-07 ENCOUNTER — Encounter (HOSPITAL_BASED_OUTPATIENT_CLINIC_OR_DEPARTMENT_OTHER): Admitting: Family

## 2024-07-17 ENCOUNTER — Ambulatory Visit (HOSPITAL_BASED_OUTPATIENT_CLINIC_OR_DEPARTMENT_OTHER): Admitting: Family

## 2024-07-17 NOTE — Progress Notes (Deleted)
 "  [facility] Assessment:    Date:  07/17/2024   ID:  Anna Holt, DOB July 05, 2001, MRN [id number]  PCP:  Anna Cole, MD  Cardiologist:  Annabella Scarce, MD  Nephrologist:  Referring MD: Anna Cole, MD   CC: Hypertension  History of Present Illness:    Anna Holt is a 23 y.o. female with a hx of hypertension here to follow up in the [facility].   Established with [facility] 11/03/23. Diagnosis of HTN at 23 yo. Family  history with HTN in her mother and maternal grandfather. She was exercising multiple times per week with cardio and weight lifting and not routinely following a low sodium diet. No tobacco use, no longer smoking THC, and social alcohol use. Previously did not tolerate Lisinopril with dizziness, nausea. Was feeling poorly on even reduced dose Metoprolol with feeling sluggish. Metanephrines, catecholamines, renin-aldosterone ordered but not collected. She had sleep study with [facility] and was awaiting results. She was recommended to stop Metoprolol and start Nifedipine .  At visit 01/19/24 BP not at goal. She had not transitioned from Metoprolol to Nifedipine  and was encouraged to do so.   She is presently [redacted] weeks pregnant. ED visit 04/19/24 for COVID 19 at which time metoprolol changed to labetolol. Reports no shortness of breath nor dyspnea on exertion. Reports no chest pain, pressure, or tightness. No edema, orthopnea, PND. Reports no palpitations.  Reports BP at home such as 160/90. Interestingly at [facility] yesterday reports BP 118/62 with no associated lightheadedness. Discussed the importance of BP control in pregnancy. ***  Labetolol 600mg  BID  Nifedipine  30mg  daily  Previous antihypertensives: Lisinopril - dizziness, nausea Metoprolol  Past Medical History:  Diagnosis Date   Hypertension     Past Surgical History:  Procedure Laterality Date   ADENOIDECTOMY     TONSILLECTOMY     WISDOM TOOTH EXTRACTION      Current Medications: No outpatient medications have been marked as taking for the 07/17/24 encounter (Appointment) with Vannie Reche RAMAN, NP.     Allergies:   Lisinopril and Norethindrone   Social History   Socioeconomic History   Marital status: Single    Spouse name: Not on file   Number of children: Not on file   Years of education: Not on file   Highest education level: Not on file  Occupational History   Not on file  Tobacco Use   Smoking status: Never   Smokeless tobacco: Never  Substance and Sexual Activity   Alcohol use: Never   Drug use: Never   Sexual activity: Not on file  Other Topics Concern   Not on file  Social History Narrative   Not on file   Social Drivers of Health   Tobacco Use: Low Risk (05/24/2024)   Patient History    Smoking Tobacco Use: Never    Smokeless Tobacco Use: Never    Passive Exposure: Not on file  Financial Resource Strain: Not on file  Food Insecurity: No Food Insecurity (11/03/2023)   Hunger Vital Sign    Worried About Running Out of Food in the Last Year: Never true    Ran Out of Food in the Last Year: Never true  Transportation Needs: No Transportation Needs (11/03/2023)   PRAPARE - Administrator, Civil Service (Medical): No    Lack of Transportation (Non-Medical): No  Physical Activity: Sufficiently Active (11/03/2023)   Exercise Vital Sign    Days of Exercise per Week: 5 days  Minutes of Exercise per Session: 60 min  Stress: Not on file  Social Connections: Not on file  Depression (EYV7-0): Not on file  Alcohol Screen: Not on file  Housing: Unknown (11/03/2023)   Housing Stability Vital Sign    Unable to Pay for Housing in the Last Year: No    Number of Times Moved in the Last Year: Not on file    Homeless in the Last Year: No  Utilities: Not At Risk (11/03/2023)   AHC Utilities    Threatened with loss of utilities: No  Health Literacy: Not on file    Family History: The patient's family history  includes Hypertension in her maternal grandfather and mother.  ROS:   Please see the history of present illness.     All other systems reviewed and are negative.  EKGs/Labs/Other Studies Reviewed:         Recent Labs: 04/20/2024: ALT 15; BUN 6; Creatinine, Ser 0.58; Hemoglobin 12.3; Platelets 305; Potassium 3.5; Sodium 133   Recent Lipid Panel No results found for: CHOL, TRIG, HDL, CHOLHDL, VLDL, LDLCALC, LDLDIRECT  Physical Exam:   VS:  LMP 12/02/2023 (Approximate)  , BMI There is no height or weight on file to calculate BMI.  There were no vitals filed for this visit.  GENERAL:  Well appearing HEENT: Pupils equal round and reactive, fundi not visualized, oral mucosa unremarkable NECK:  No jugular venous distention, waveform within normal limits, carotid upstroke brisk and symmetric, no bruits, no thyromegaly LYMPHATICS:  No cervical adenopathy LUNGS:  Clear to auscultation bilaterally HEART:  RRR.  PMI not displaced or sustained,S1 and S2 within normal limits, no S3, no S4, no clicks, no rubs, no  murmurs ABD:  Flat, positive bowel sounds normal in frequency in pitch, no bruits, no rebound, no guarding, no midline pulsatile mass, no hepatomegaly, no splenomegaly EXT:  2 plus pulses throughout, no edema, no cyanosis no clubbing SKIN:  No rashes no nodules NEURO:  Cranial nerves II through XII grossly intact, motor grossly intact throughout PSYCH:  Cognitively intact, oriented to person place and time   ASSESSMENT/PLAN:    HTN - BP not at goal <130/80.Presently [redacted] weeks pregnant with a baby girl. Discussed importance of BP control in pregnancy.  She remains somewhat hesitant regarding medication changes.  Increase labetalol  from 400 twice daily to 600 mg twice daily.  Prompt follow-up in 2 to 3 weeks.  If BP not at goal less than 130/80 we discussed that we will need to add nifedipine  30 mg daily as previously recommended. Discussed to monitor BP at home at least 2  hours after medications and sitting for 5-10 minutes.   Screening for Secondary Hypertension:     11/03/2023   11:52 AM  Causes  Drugs/Herbals Screened     - Comments no OTC agents, limits caffeine  Renovascular HTN Screened     - Comments 2021 normal renal arteries by duplex  Sleep Apnea Screened     - Comments 10/2023 recently completed sleep study, await results from [facility]  Thyroid Disease Screened     - Comments normal TSH  Hyperaldosteronism Screened     - Comments 11/03/23 renin-aldosterone ordered  Pheochromocytoma Screened     - Comments 11/03/23 metanephrines, catecholamines ordered  Cushing's Syndrome N/A     - Comments non cushingoid appearance  Coarctation of the Aorta N/A     - Comments BP symmetrical  Compliance Screened     - Comments Taking medication routinely  Relevant Labs/Studies:    Latest Ref Rng & Units 04/20/2024    1:30 AM 07/27/2023   12:51 PM 07/27/2023   12:39 PM  Basic Labs  Sodium 135 - 145 mmol/L 133  135  135   Potassium 3.5 - 5.1 mmol/L 3.5  3.6  3.7   Creatinine 0.44 - 1.00 mg/dL 9.41  9.19  9.18                     Disposition:    FU with MD/APP/PharmD in *** weeks    Medication Adjustments/Labs and Tests Ordered: Current medicines are reviewed at length with the patient today.  Concerns regarding medicines are outlined above.  No orders of the defined types were placed in this encounter.  No orders of the defined types were placed in this encounter.    Signed, Reche GORMAN Finder, NP  07/17/2024 7:54 AM    [facility] "

## 2024-07-20 DIAGNOSIS — O99210 Obesity complicating pregnancy, unspecified trimester: Secondary | ICD-10-CM | POA: Insufficient documentation

## 2024-07-20 DIAGNOSIS — O10919 Unspecified pre-existing hypertension complicating pregnancy, unspecified trimester: Secondary | ICD-10-CM | POA: Insufficient documentation

## 2024-08-01 ENCOUNTER — Ambulatory Visit: Admitting: Obstetrics

## 2024-08-01 ENCOUNTER — Ambulatory Visit: Attending: Obstetrics and Gynecology

## 2024-08-01 ENCOUNTER — Other Ambulatory Visit: Payer: Self-pay | Admitting: *Deleted

## 2024-08-01 VITALS — BP 160/100

## 2024-08-01 DIAGNOSIS — O99210 Obesity complicating pregnancy, unspecified trimester: Secondary | ICD-10-CM | POA: Insufficient documentation

## 2024-08-01 DIAGNOSIS — O10912 Unspecified pre-existing hypertension complicating pregnancy, second trimester: Secondary | ICD-10-CM

## 2024-08-01 DIAGNOSIS — O10919 Unspecified pre-existing hypertension complicating pregnancy, unspecified trimester: Secondary | ICD-10-CM

## 2024-08-01 DIAGNOSIS — Z3A23 23 weeks gestation of pregnancy: Secondary | ICD-10-CM | POA: Diagnosis present

## 2024-08-01 DIAGNOSIS — O10012 Pre-existing essential hypertension complicating pregnancy, second trimester: Secondary | ICD-10-CM | POA: Diagnosis not present

## 2024-08-01 DIAGNOSIS — E669 Obesity, unspecified: Secondary | ICD-10-CM | POA: Diagnosis not present

## 2024-08-01 DIAGNOSIS — O99213 Obesity complicating pregnancy, third trimester: Secondary | ICD-10-CM | POA: Insufficient documentation

## 2024-08-01 DIAGNOSIS — O99212 Obesity complicating pregnancy, second trimester: Secondary | ICD-10-CM

## 2024-08-01 DIAGNOSIS — Z362 Encounter for other antenatal screening follow-up: Secondary | ICD-10-CM

## 2024-08-01 NOTE — Progress Notes (Signed)
 MFM Consult Note  Anna Holt is currently at [redacted]w[redacted]d. She was seen today for a detailed fetal anatomy scan due to chronic hypertension currently treated with labetalol  800 mg twice a day.  The patient reports that she was diagnosed with chronic hypertension about 5 years ago.  She is being followed by cardiology for management of her hypertension.  Her cardiologist recommended adding nifedipine  to her current antihypertensive regimen should her blood pressures continue to be elevated.  Her blood pressures today were 160/101 and 168/90.  The patient reports that she did not take her labetalol  this morning.  She denies any problems in her current pregnancy.    She had a cell free DNA test earlier in her pregnancy which indicated a low risk for trisomy 40, 43, and 13. A female fetus is predicted.   Sonographic findings Single intrauterine pregnancy at 23w 2d. Fetal cardiac activity:  Observed and appears normal. Presentation: Breech. The views of the fetal anatomy were limited today due to the fetal position.  What was visualized today appeared within normal limits. Fetal biometry shows the estimated fetal weight of 1 lb 3 oz, 537 grams (22%). Amniotic fluid: Within normal limits.  MVP: 5.33 cm. Placenta: Posterior. Adnexa: No abnormality visualized. Cervical length: 4 cm.  The patient was informed that anomalies may be missed due to technical limitations. If the fetus is in a suboptimal position or maternal habitus is increased, visualization of the fetus in the maternal uterus may be impaired.  Chronic Hypertension in Pregnancy  The implications and management of chronic hypertension in pregnancy was discussed.   She was advised to continue taking labetalol  as prescribed for the duration of her pregnancy.  Consideration should be given to adding nifedipine  XL 30 mg daily should her blood pressures remain elevated.  She was advised to continue monitoring her blood pressures at  home.  The increased risk of superimposed preeclampsia, an indicated preterm delivery, and possible fetal growth restriction due to chronic hypertension in pregnancy was discussed.   We will continue to follow her with monthly growth ultrasounds.  Weekly fetal testing should be started at 32 weeks and continued until delivery.  She should also have a baseline P/C ratio or a 24-hour urine collected to assess for total protein in her urine.  To decrease her risk of superimposed preeclampsia, she should continue taking a daily baby aspirin (81 mg daily) for preeclampsia prophylaxis.   As her blood pressures will most likely be difficult to control, the patient was advised that the goal for her delivery will be at around 37 weeks.    A follow-up growth scan was scheduled in 4 weeks.  The patient stated that all of her questions were answered.   A total of 45 minutes was spent counseling and coordinating the care for this patient.  Greater than 50% of the time was spent in direct face-to-face contact.

## 2024-08-30 ENCOUNTER — Ambulatory Visit

## 2024-08-30 ENCOUNTER — Encounter (HOSPITAL_COMMUNITY): Payer: Self-pay | Admitting: Obstetrics & Gynecology

## 2024-08-30 ENCOUNTER — Inpatient Hospital Stay (HOSPITAL_COMMUNITY)
Admission: AD | Admit: 2024-08-30 | Source: Ambulatory Visit | Attending: Obstetrics and Gynecology | Admitting: Obstetrics and Gynecology

## 2024-08-30 ENCOUNTER — Ambulatory Visit (HOSPITAL_BASED_OUTPATIENT_CLINIC_OR_DEPARTMENT_OTHER): Admitting: Obstetrics and Gynecology

## 2024-08-30 VITALS — BP 160/100 | HR 86

## 2024-08-30 DIAGNOSIS — O0990 Supervision of high risk pregnancy, unspecified, unspecified trimester: Secondary | ICD-10-CM

## 2024-08-30 DIAGNOSIS — Z3A27 27 weeks gestation of pregnancy: Secondary | ICD-10-CM

## 2024-08-30 DIAGNOSIS — Z362 Encounter for other antenatal screening follow-up: Secondary | ICD-10-CM

## 2024-08-30 DIAGNOSIS — O99212 Obesity complicating pregnancy, second trimester: Secondary | ICD-10-CM

## 2024-08-30 DIAGNOSIS — O10012 Pre-existing essential hypertension complicating pregnancy, second trimester: Secondary | ICD-10-CM | POA: Diagnosis not present

## 2024-08-30 DIAGNOSIS — E669 Obesity, unspecified: Secondary | ICD-10-CM | POA: Diagnosis not present

## 2024-08-30 DIAGNOSIS — Z6832 Body mass index (BMI) 32.0-32.9, adult: Secondary | ICD-10-CM

## 2024-08-30 DIAGNOSIS — O10919 Unspecified pre-existing hypertension complicating pregnancy, unspecified trimester: Secondary | ICD-10-CM

## 2024-08-30 DIAGNOSIS — O1410 Severe pre-eclampsia, unspecified trimester: Principal | ICD-10-CM | POA: Diagnosis present

## 2024-08-30 LAB — COMPREHENSIVE METABOLIC PANEL WITH GFR
ALT: 12 U/L (ref 0–44)
AST: 15 U/L (ref 15–41)
Albumin: 3.8 g/dL (ref 3.5–5.0)
Alkaline Phosphatase: 80 U/L (ref 38–126)
Anion gap: 12 (ref 5–15)
BUN: 6 mg/dL (ref 6–20)
CO2: 19 mmol/L — ABNORMAL LOW (ref 22–32)
Calcium: 9.2 mg/dL (ref 8.9–10.3)
Chloride: 104 mmol/L (ref 98–111)
Creatinine, Ser: 0.51 mg/dL (ref 0.44–1.00)
GFR, Estimated: >60 mL/min
Glucose, Bld: 73 mg/dL (ref 70–99)
Potassium: 4.5 mmol/L (ref 3.5–5.1)
Sodium: 134 mmol/L — ABNORMAL LOW (ref 135–145)
Total Bilirubin: 0.2 mg/dL (ref 0.0–1.2)
Total Protein: 7 g/dL (ref 6.5–8.1)

## 2024-08-30 LAB — CBC
HCT: 33.6 % — ABNORMAL LOW (ref 36.0–46.0)
Hemoglobin: 11.1 g/dL — ABNORMAL LOW (ref 12.0–15.0)
MCH: 28.5 pg (ref 26.0–34.0)
MCHC: 33 g/dL (ref 30.0–36.0)
MCV: 86.2 fL (ref 80.0–100.0)
Platelets: 272 10*3/uL (ref 150–400)
RBC: 3.9 MIL/uL (ref 3.87–5.11)
RDW: 13.2 % (ref 11.5–15.5)
WBC: 11.6 10*3/uL — ABNORMAL HIGH (ref 4.0–10.5)
nRBC: 0 % (ref 0.0–0.2)

## 2024-08-30 LAB — PROTEIN / CREATININE RATIO, URINE
Creatinine, Urine: 82 mg/dL
Protein Creatinine Ratio: 0.2 mg/mg — ABNORMAL HIGH
Total Protein, Urine: 13 mg/dL

## 2024-08-30 LAB — TYPE AND SCREEN
ABO/RH(D): A POS
Antibody Screen: NEGATIVE

## 2024-08-30 MED ORDER — HYDRALAZINE HCL 20 MG/ML IJ SOLN: 5.0000 mg | INTRAMUSCULAR | Status: AC | PRN

## 2024-08-30 MED ORDER — BETAMETHASONE SOD PHOS & ACET 6 (3-3) MG/ML IJ SUSP
12.0000 mg | INTRAMUSCULAR | Status: AC
  Administered 2024-08-30 – 2024-08-31 (×2): 12 mg via INTRAMUSCULAR
  Filled 2024-08-30 (×2): qty 5

## 2024-08-30 MED ORDER — CALCIUM CARBONATE ANTACID 500 MG PO CHEW: 2.0000 | CHEWABLE_TABLET | ORAL | Status: AC | PRN

## 2024-08-30 MED ORDER — LABETALOL HCL 5 MG/ML IV SOLN: 20.0000 mg | INTRAVENOUS | Status: AC | PRN

## 2024-08-30 MED ORDER — LABETALOL HCL 5 MG/ML IV SOLN
40.0000 mg | INTRAVENOUS | Status: DC | PRN
  Administered 2024-08-30 (×2): 40 mg via INTRAVENOUS
  Filled 2024-08-30 (×3): qty 8

## 2024-08-30 MED ORDER — MAGNESIUM SULFATE BOLUS VIA INFUSION
6.0000 g | Freq: Once | INTRAVENOUS | Status: AC
  Administered 2024-08-30: 6 g via INTRAVENOUS
  Filled 2024-08-30: qty 1000

## 2024-08-30 MED ORDER — PRENATAL MULTIVITAMIN CH
1.0000 | ORAL_TABLET | Freq: Every day | ORAL | Status: AC
  Administered 2024-08-31: 1 via ORAL
  Filled 2024-08-30: qty 1

## 2024-08-30 MED ORDER — LABETALOL HCL 5 MG/ML IV SOLN
80.0000 mg | INTRAVENOUS | Status: DC | PRN
  Administered 2024-08-30: 80 mg via INTRAVENOUS

## 2024-08-30 MED ORDER — MAGNESIUM SULFATE 40 GM/1000ML IV SOLN
2.0000 g/h | INTRAVENOUS | Status: AC
  Administered 2024-08-31: 2 g/h via INTRAVENOUS
  Filled 2024-08-30 (×2): qty 1000

## 2024-08-30 MED ORDER — LABETALOL HCL 5 MG/ML IV SOLN: 40.0000 mg | INTRAVENOUS | Status: AC | PRN

## 2024-08-30 MED ORDER — HYDRALAZINE HCL 20 MG/ML IJ SOLN: 10.0000 mg | INTRAMUSCULAR | Status: AC | PRN

## 2024-08-30 MED ORDER — LACTATED RINGERS IV SOLN: INTRAVENOUS | Status: AC

## 2024-08-30 MED ORDER — DOCUSATE SODIUM 100 MG PO CAPS: 100.0000 mg | ORAL_CAPSULE | Freq: Every day | ORAL | Status: DC

## 2024-08-30 MED ORDER — HYDRALAZINE HCL 20 MG/ML IJ SOLN
10.0000 mg | INTRAMUSCULAR | Status: DC | PRN
  Administered 2024-08-30: 10 mg via INTRAVENOUS
  Filled 2024-08-30: qty 1

## 2024-08-30 MED ORDER — NIFEDIPINE ER OSMOTIC RELEASE 30 MG PO TB24
30.0000 mg | ORAL_TABLET | Freq: Once | ORAL | Status: AC
  Administered 2024-08-30: 30 mg via ORAL
  Filled 2024-08-30: qty 1

## 2024-08-30 MED ORDER — HYDRALAZINE HCL 20 MG/ML IJ SOLN: 10.0000 mg | INTRAMUSCULAR | Status: DC | PRN

## 2024-08-30 MED ORDER — ACETAMINOPHEN 325 MG PO TABS: 650.0000 mg | ORAL_TABLET | ORAL | Status: AC | PRN

## 2024-08-30 MED ORDER — LABETALOL HCL 100 MG PO TABS: 800.0000 mg | ORAL_TABLET | Freq: Two times a day (BID) | ORAL | Status: DC

## 2024-08-30 MED ORDER — LACTATED RINGERS IV SOLN: 125.0000 mL/h | INTRAVENOUS | Status: DC

## 2024-08-30 MED ORDER — NIFEDIPINE ER OSMOTIC RELEASE 60 MG PO TB24
60.0000 mg | ORAL_TABLET | Freq: Every day | ORAL | Status: AC
  Administered 2024-08-31: 60 mg via ORAL
  Filled 2024-08-30: qty 1

## 2024-08-30 MED ORDER — NIFEDIPINE ER OSMOTIC RELEASE 30 MG PO TB24
30.0000 mg | ORAL_TABLET | Freq: Every day | ORAL | Status: DC
  Administered 2024-08-30: 30 mg via ORAL
  Filled 2024-08-30: qty 1

## 2024-08-30 MED ORDER — LABETALOL HCL 5 MG/ML IV SOLN
20.0000 mg | INTRAVENOUS | Status: DC | PRN
  Administered 2024-08-30: 20 mg via INTRAVENOUS

## 2024-08-30 MED ORDER — LABETALOL HCL 5 MG/ML IV SOLN
40.0000 mg | INTRAVENOUS | Status: DC | PRN
  Administered 2024-08-30: 40 mg via INTRAVENOUS

## 2024-08-30 MED ORDER — LABETALOL HCL 5 MG/ML IV SOLN
INTRAVENOUS | Status: AC
  Filled 2024-08-30: qty 4

## 2024-08-30 MED ORDER — LABETALOL HCL 5 MG/ML IV SOLN
80.0000 mg | INTRAVENOUS | Status: DC | PRN
  Administered 2024-08-30: 80 mg via INTRAVENOUS
  Filled 2024-08-30 (×2): qty 16

## 2024-08-30 MED ORDER — LABETALOL HCL 200 MG PO TABS
800.0000 mg | ORAL_TABLET | Freq: Two times a day (BID) | ORAL | Status: AC
  Administered 2024-08-31 (×2): 800 mg via ORAL
  Filled 2024-08-30 (×2): qty 4

## 2024-08-30 MED ORDER — LABETALOL HCL 5 MG/ML IV SOLN
20.0000 mg | INTRAVENOUS | Status: DC | PRN
  Administered 2024-08-30 (×2): 20 mg via INTRAVENOUS
  Filled 2024-08-30 (×2): qty 4

## 2024-08-30 NOTE — MAU Note (Signed)
 Anna Holt is a 24 y.o. at [redacted]w[redacted]d here in MAU reporting: went MFM today and b/p was elevated.  On 400mg  Labetalol  BID CHTN. Denies any headache or visual changes. Good fetal movement felt.   LMP:  Onset of complaint: today Pain score: 0 Vitals:   08/30/24 1257  BP: (!) 182/105  Pulse: 87  Resp: 18  Temp: 98.3 F (36.8 C)     FHT: 133  Lab orders placed from triage: [facility]

## 2024-08-30 NOTE — MAU Note (Signed)
 RN called pharmacy to request additional Labetalol  as pyxis is running low with one carpuject left. Pharmacy is bringing labetalol  down to unit.

## 2024-08-30 NOTE — Plan of Care (Signed)
  Problem: Education: Goal: Knowledge of disease or condition will improve Outcome: Progressing Goal: Knowledge of the prescribed therapeutic regimen will improve Outcome: Progressing   Problem: Fluid Volume: Goal: Peripheral tissue perfusion will improve Outcome: Progressing   Problem: Clinical Measurements: Goal: Complications related to disease process, condition or treatment will be avoided or minimized Outcome: Progressing   Problem: Education: Goal: Knowledge of disease or condition will improve Outcome: Progressing Goal: Knowledge of the prescribed therapeutic regimen will improve Outcome: Progressing Goal: Individualized Educational Video(s) Outcome: Progressing   Problem: Clinical Measurements: Goal: Complications related to the disease process, condition or treatment will be avoided or minimized Outcome: Progressing   Problem: Education: Goal: Knowledge of General Education information will improve Description: Including pain rating scale, medication(s)/side effects and non-pharmacologic comfort measures Outcome: Progressing   Problem: Health Behavior/Discharge Planning: Goal: Ability to manage health-related needs will improve Outcome: Progressing   Problem: Clinical Measurements: Goal: Ability to maintain clinical measurements within normal limits will improve Outcome: Progressing Goal: Will remain free from infection Outcome: Progressing Goal: Diagnostic test results will improve Outcome: Progressing Goal: Respiratory complications will improve Outcome: Progressing Goal: Cardiovascular complication will be avoided Outcome: Progressing   Problem: Activity: Goal: Risk for activity intolerance will decrease Outcome: Progressing   Problem: Nutrition: Goal: Adequate nutrition will be maintained Outcome: Progressing   Problem: Coping: Goal: Level of anxiety will decrease Outcome: Progressing   Problem: Elimination: Goal: Will not experience  complications related to bowel motility Outcome: Progressing Goal: Will not experience complications related to urinary retention Outcome: Progressing   Problem: Pain Managment: Goal: General experience of comfort will improve and/or be controlled Outcome: Progressing   Problem: Safety: Goal: Ability to remain free from injury will improve Outcome: Progressing   Problem: Skin Integrity: Goal: Risk for impaired skin integrity will decrease Outcome: Progressing

## 2024-08-30 NOTE — MAU Provider Note (Signed)
 FACULTY PRACTICE ANTEPARTUM ADMISSION HISTORY AND PHYSICAL NOTE   History of Present Illness: Anna Holt is a 24 y.o. G1P0 at [redacted]w[redacted]d admitted for pre-eclampsia with severe features.   Patient reports the fetal movement as active. Patient reports uterine contraction  activity as none. Patient reports  vaginal bleeding as none. Patient describes fluid per vagina as None.   Patient Active Problem List   Diagnosis Date Noted   Preeclampsia, severe 08/30/2024   Chronic hypertension during pregnancy, antepartum 07/20/2024   Obesity affecting pregnancy 07/20/2024    Past Medical History:  Diagnosis Date   Hypertension     Past Surgical History:  Procedure Laterality Date   ADENOIDECTOMY     TONSILLECTOMY     TYMPANOSTOMY TUBE PLACEMENT     WISDOM TOOTH EXTRACTION      OB History  Gravida Para Term Preterm AB Living  1       SAB IAB Ectopic Multiple Live Births          # Outcome Date GA Lbr Len/2nd Weight Sex Type Anes PTL Lv  1 Current             Social History   Socioeconomic History   Marital status: Single    Spouse name: Not on file   Number of children: Not on file   Years of education: Not on file   Highest education level: Not on file  Occupational History   Not on file  Tobacco Use   Smoking status: Never   Smokeless tobacco: Never  Vaping Use   Vaping status: Never Used  Substance and Sexual Activity   Alcohol use: Never   Drug use: Never   Sexual activity: Not Currently  Other Topics Concern   Not on file  Social History Narrative   Not on file   Social Drivers of Health   Tobacco Use: Low Risk (08/30/2024)   Patient History    Smoking Tobacco Use: Never    Smokeless Tobacco Use: Never    Passive Exposure: Not on file  Financial Resource Strain: Not on file  Food Insecurity: No Food Insecurity (11/03/2023)   Hunger Vital Sign    Worried About Running Out of Food in the Last Year: Never true    Ran Out of Food in the Last Year: Never  true  Transportation Needs: No Transportation Needs (11/03/2023)   PRAPARE - Administrator, Civil Service (Medical): No    Lack of Transportation (Non-Medical): No  Physical Activity: Sufficiently Active (11/03/2023)   Exercise Vital Sign    Days of Exercise per Week: 5 days    Minutes of Exercise per Session: 60 min  Stress: Not on file  Social Connections: Not on file  Depression (EYV7-0): Not on file  Alcohol Screen: Not on file  Housing: Unknown (11/03/2023)   Housing Stability Vital Sign    Unable to Pay for Housing in the Last Year: No    Number of Times Moved in the Last Year: Not on file    Homeless in the Last Year: No  Utilities: Not At Risk (11/03/2023)   [employer] Utilities    Threatened with loss of utilities: No  Health Literacy: Not on file    Family History  Problem Relation Age of Onset   Hypertension Mother    Hypertension Maternal Grandfather     Allergies[1]  Medications Prior to Admission  Medication Sig Dispense Refill Last Dose/Taking   labetalol  (NORMODYNE ) 200 MG tablet Take 400 mg by  mouth 2 (two) times daily.   08/30/2024   aspirin EC 81 MG tablet Take 81 mg by mouth daily. Swallow whole.      Prenatal Multivit-Min-Fe-FA (PRE-NATAL PO) Take 2 tablets by mouth daily at 6 (six) AM.      triamcinolone cream (KENALOG) 0.1 % Apply 1 Application topically 2 (two) times daily.       Review of Systems - Negative except what is listed in HPI  Vitals:  BP (!) 172/100 Comment: Dr. Magali at bedside  Pulse 95   Temp 98.3 F (36.8 C)   Resp 18   Ht 5' 8 (1.727 m)   Wt 95.3 kg   LMP 02/20/2024   SpO2 100%   BMI 31.93 kg/m  Physical Examination: CONSTITUTIONAL: Well-developed, well-nourished female in no acute distress.  HENT:  Normocephalic, atraumatic, External right and left ear normal. Oropharynx is clear and moist EYES: Conjunctivae and EOM are normal. Pupils are equal, round, and reactive to light. No scleral icterus.  NECK: Normal range of  motion, supple, no masses SKIN: Skin is warm and dry. No rash noted. Not diaphoretic. No erythema. No pallor. NEUROLGIC: Alert and oriented to person, place, and time. Normal reflexes, muscle tone coordination. No cranial nerve deficit noted. PSYCHIATRIC: Normal mood and affect. Normal behavior. Normal judgment and thought content. CARDIOVASCULAR: Normal heart rate noted, regular rhythm RESPIRATORY: Effort and breath sounds normal, no problems with respiration noted ABDOMEN: Soft, nontender, nondistended, gravid. MUSCULOSKELETAL: Normal range of motion. No edema and no tenderness. 2+ distal pulses.  Cervix: Not evaluated Membranes:intact Fetal Monitoring:Baseline: 140 bpm, Variability: Good {> 6 bpm), Accelerations: Reactive, and Decelerations: Absent Tocometer: Flat  Labs:  Results for orders placed or performed during the hospital encounter of 08/30/24 (from the past 24 hours)  CBC   Collection Time: 08/30/24 12:45 PM  Result Value Ref Range   WBC 11.6 (H) 4.0 - 10.5 K/uL   RBC 3.90 3.87 - 5.11 MIL/uL   Hemoglobin 11.1 (L) 12.0 - 15.0 g/dL   HCT 66.3 (L) 63.9 - 53.9 %   MCV 86.2 80.0 - 100.0 fL   MCH 28.5 26.0 - 34.0 pg   MCHC 33.0 30.0 - 36.0 g/dL   RDW 86.7 88.4 - 84.4 %   Platelets 272 150 - 400 K/uL   nRBC 0.0 0.0 - 0.2 %  Comprehensive metabolic panel   Collection Time: 08/30/24 12:45 PM  Result Value Ref Range   Sodium 134 (L) 135 - 145 mmol/L   Potassium 4.5 3.5 - 5.1 mmol/L   Chloride 104 98 - 111 mmol/L   CO2 19 (L) 22 - 32 mmol/L   Glucose, Bld 73 70 - 99 mg/dL   BUN 6 6 - 20 mg/dL   Creatinine, Ser 9.48 0.44 - 1.00 mg/dL   Calcium  9.2 8.9 - 10.3 mg/dL   Total Protein 7.0 6.5 - 8.1 g/dL   Albumin 3.8 3.5 - 5.0 g/dL   AST 15 15 - 41 U/L   ALT 12 0 - 44 U/L   Alkaline Phosphatase 80 38 - 126 U/L   Total Bilirubin 0.2 0.0 - 1.2 mg/dL   GFR, Estimated >39 >39 mL/min   Anion gap 12 5 - 15  Protein / creatinine ratio, urine   Collection Time: 08/30/24  1:10 PM   Result Value Ref Range   Creatinine, Urine 82 mg/dL   Total Protein, Urine 13 mg/dL   Protein Creatinine Ratio 0.2 (H) <0.2 mg/mg    Imaging Studies: US  MFM  OB FOLLOW UP Result Date: 08/30/2024 ----------------------------------------------------------------------  OBSTETRICS REPORT                       (Signed Final 08/30/2024 12:27 pm) ---------------------------------------------------------------------- Patient Info  ID #:       [id number]                          D.O.B.:  October 23, 2000 (23 yrs)(F)  Name:       Anna Holt               Visit Date: 08/30/2024 11:19 am ---------------------------------------------------------------------- Performed By  Attending:        Fredia Fresh MD        Ref. Address:     [address]                                                             [address]                                                             [place]                                                             [address]  Performed By:     Emmaline Forte         [facility]  Referred By:      [facility] ---------------------------------------------------------------------- Orders  #  Description                           Code  Ordered By  1  US  MFM OB FOLLOW UP                   [id number]    YU FANG ----------------------------------------------------------------------  #  Order #                     Accession #                Episode #  1  [id number]                   [id number]                 [id number] ---------------------------------------------------------------------- Indications  Pre-existing essential hypertension            O10.012  complicating pregnancy, second trimester  (Labetalol )  Obesity complicating  pregnancy, second         O99.212  trimester (BMI 32)  Low risk NIPS female - Neg Horizon  Antenatal follow-up for nonvisualized fetal    Z36.2  anatomy  [redacted] weeks gestation of pregnancy                Z3A.27 ---------------------------------------------------------------------- Vital Signs  BP:          172/96 ---------------------------------------------------------------------- Fetal Evaluation  Num Of Fetuses:         1  Preg. Location:         Intrauterine  Fetal Heart Rate(bpm):  150  Cardiac Activity:       Observed  Presentation:           Cephalic  Placenta:               Posterior  P. Cord Insertion:      Visualized  Amniotic Fluid  AFI FV:      Within normal limits                              Largest Pocket(cm)                              4.41 ---------------------------------------------------------------------- Biometry  BPD:      68.8  mm     G. Age:  27w 5d         46  %    CI:        71.07   %    70 - 86                                                          FL/HC:      19.2   %    18.6 - 20.4  HC:       260   mm     G. Age:  28w 2d         48  %    HC/AC:      1.16        1.05 - 1.21  AC:      223.6  mm     G. Age:  26w 5d         23  %    FL/BPD:     72.5   %  71 - 87  FL:       49.9  mm     G. Age:  26w 6d         20  %    FL/AC:      22.3   %    20 - 24  LV:        3.3  mm  Est. FW:    1013  gm      2 lb 4 oz     23  % ---------------------------------------------------------------------- Gestational Age  LMP:           27w 3d        Date:  02/20/24                  EDD:   11/26/24  U/S Today:     27w 3d                                        EDD:   11/26/24  Best:          27w 3d     Det. By:  LMP  (02/20/24)          EDD:   11/26/24 ---------------------------------------------------------------------- Targeted Anatomy  Central Nervous System  Calvarium/Cranial V.:  Previously seen        Cereb./Vermis:          Previously seen  Cavum:                 Previously seen        Sales Executive:          Previously seen  Lateral Ventricles:    Appears normal         Midline Falx:           Previously seen  Choroid Plexus:        Previously seen  Spine  Cervical:              Previously seen        Sacral:                 Appears normal  Thoracic:              Previously seen        Shape/Curvature:        Previously seen  Lumbar:                Previously seen  Head/Neck  Lips:                  Previously seen        Profile:                Previously seen  Neck:                  Previously seen        Orbits/Eyes:            Previously seen  Nuchal Fold:           Not applicable         Mandible:               Previously seen  Nasal Bone:            Previously seen        Maxilla:  Previously seen  Thorax  4 Chamber View:        Appears normal         Interventr. Septum:     Previously seen  Cardiac Rhythm:        Normal                 Cardiac Axis:           Previously seen  Cardiac Situs:         Previously seen        Diaphragm:              Previously seen  Rt Outflow Tract:      Previously seen        3 Vessel View:          Previously seen  Lt Outflow Tract:      Previously seen        3 V Trachea View:       Previously seen  Aortic Arch:           Previously seen        IVC:                    Previously seen  Ductal Arch:           Previously seen        Crossing:               Previously seen  SVC:                   Previously seen  Abdomen  Ventral Wall:          Previously seen        Lt Kidney:              Appears normal  Cord Insertion:        Previously seen        Rt Kidney:              Appears normal  Situs:                 Previously seen        Bladder:                Appears normal  Stomach:               Appears normal  Extremities  Lt Humerus:            Previously seen        Lt Femur:               Previously seen  Rt Humerus:            Previously seen        Rt Femur:               Previously seen  Lt Forearm:            Previously seen        Lt Lower Leg:            Previously seen  Rt Forearm:            Previously seen        Rt Lower Leg:           Previously seen  Lt Hand:               Previously seen  Lt Foot:                Previously seen  Rt Hand:               Open hand nml          Rt Foot:                Previously seen  Other  Umbilical Cord:        Previously seen        Genitalia:              Female prev seen  Comment:     Fetal anatomic survey complete. ---------------------------------------------------------------------- Cervix Uterus Adnexa  Cervix  Not visualized (advanced GA >24wks)  Uterus  No abnormality visualized.  Right Ovary  Not visualized.  Left Ovary  Not visualized.  Cul De Sac  No free fluid seen.  Adnexa  No abnormality visualized ---------------------------------------------------------------------- Impression  Patient is here for fetal growth assessment and completion of  fetal anatomy. She has chronic hypertension and takes  labetalol . Blood pressures today at our office are 160/100 and  172/96 mm Hg. She does not have signs and symptoms of  severe features of preeclampsia.  Ultrasound  Normal fetal growth and amniotic fluid. Fetal anatomy  appears normal.  Patient took her labetalol  today morning. I discussed the  significance of severe range hypertension and recommended  evaluation at the MAU with series of blood pressures and  blood work. Superimposed preeclampsia needs to be ruled  out but may be difficult to differentiate.  Patient agreed to go to the MAU.  MAU team was informed. ---------------------------------------------------------------------- Recommendations  -Patient has an appointment for ultrasound in 4 weeks. ----------------------------------------------------------------------                 Fredia Fresh, MD Electronically Signed Final Report   08/30/2024 12:27 pm ----------------------------------------------------------------------   US  MFM OB DETAIL +14 WK Result Date:  08/01/2024 ----------------------------------------------------------------------  OBSTETRICS REPORT                       (Signed Final 08/01/2024 11:43 am) ---------------------------------------------------------------------- Patient Info  ID #:       [id number]                          D.O.B.:  11/25/2000 (23 yrs)(F)  Name:       Anna Holt               Visit Date: 08/01/2024 07:16 am ---------------------------------------------------------------------- Performed By  Attending:        Steffan Keys MD         Ref. Address:     [address]                                                             [address]                                                             [place]  [address]  Performed By:     Rumaldo Sharps RDMS      Location[facility]  Referred By:      [facility] ---------------------------------------------------------------------- Orders  #  Description                           Code        Ordered By  1  US  MFM OB DETAIL +14 WK               76811.01    ORIE BONUS ----------------------------------------------------------------------  #  Order #                     Accession #                Episode #  1  [id number]                   [id number]                 [id number] ---------------------------------------------------------------------- Indications  Pre-existing essential hypertension            O10.012  complicating pregnancy, second trimester  (Labetalol )  Obesity complicating pregnancy, second         O99.212  trimester (BMI 32)  Low risk NIPS female - Neg Horizon  Encounter for antenatal screening for          Z36.3  malformations  [redacted] weeks gestation of pregnancy                 Z3A.23 ---------------------------------------------------------------------- Vital Signs  BP:          160/100 ---------------------------------------------------------------------- Fetal Evaluation  Num Of Fetuses:         1  Preg. Location:         Intrauterine  Fetal Heart Rate(bpm):  148  Cardiac Activity:       Observed  Presentation:           Breech  Placenta:               Posterior  P. Cord Insertion:      Visualized  Amniotic Fluid  AFI FV:  Within normal limits                              Largest Pocket(cm)                              5.33 ---------------------------------------------------------------------- Biometry  BPD:      54.2  mm     G. Age:  22w 3d         18  %    CI:        62.75   %    70 - 86                                                          FL/HC:      17.9   %    19.2 - 20.8  HC:      220.7  mm     G. Age:  24w 1d         67  %    HC/AC:      1.24        1.05 - 1.21  AC:      177.9  mm     G. Age:  22w 5d         23  %    FL/BPD:     72.9   %    71 - 87  FL:       39.5  mm     G. Age:  22w 5d         21  %    FL/AC:      22.2   %    20 - 24  HUM:        37  mm     G. Age:  22w 6d         35  %  CER:      26.2  mm     G. Age:  23w 4d         84  %  LV:        7.1  mm  CM:        7.4  mm  Est. FW:     537  gm      1 lb 3 oz     22  % ---------------------------------------------------------------------- Gestational Age  LMP:           23w 2d        Date:  02/20/24                  EDD:   11/26/24  U/S Today:     23w 0d                                        EDD:   11/28/24  Best:          23w 2d     Det. By:  LMP  (02/20/24)          EDD:   11/26/24 ---------------------------------------------------------------------- Targeted Anatomy  Central Nervous System  Calvarium/Cranial V.:  Appears normal  Cereb./Vermis:          Appears normal  Cavum:                 Appears normal         Cisterna Magna:         Appears normal  Lateral Ventricles:    Appears normal          Midline Falx:           Appears normal  Choroid Plexus:        Appears normal  Spine  Cervical:              Appears normal         Sacral:                 Not well visualized  Thoracic:              Appears normal         Shape/Curvature:        Appears normal  Lumbar:                Appears normal  Head/Neck  Lips:                  Appears normal         Profile:                Appears normal  Neck:                  Appears normal         Orbits/Eyes:            Appears normal  Nuchal Fold:           Not applicable         Mandible:               Appears normal  Nasal Bone:            Present                Maxilla:                Appears normal  Thorax  4 Chamber View:        Appears normal         Interventr. Septum:     Appears normal  Cardiac Rhythm:        Normal                 Cardiac Axis:           Normal  Cardiac Situs:         Appears normal         Diaphragm:              Appears normal  Rt Outflow Tract:      Appears normal         3 Vessel View:          Appears normal  Lt Outflow Tract:      Appears normal         3 V Trachea View:       Appears normal  Aortic Arch:           Appears normal         IVC:                    Appears normal  Ductal Arch:  Appears normal         Crossing:               Appears normal  SVC:                   Appears normal  Abdomen  Ventral Wall:          Appears normal         Lt Kidney:              Appears normal  Cord Insertion:        Appears normal         Rt Kidney:              Appears normal  Situs:                 Appears normal         Bladder:                Appears normal  Stomach:               Appears normal  Extremities  Lt Humerus:            Appears normal         Lt Femur:               Appears normal  Rt Humerus:            Appears normal         Rt Femur:               Appears normal  Lt Forearm:            Appears normal         Lt Lower Leg:           Appears normal  Rt Forearm:            Appears normal         Rt Lower Leg:            Appears normal  Lt Hand:               Open hand nml          Lt Foot:                Nml heel/foot  Rt Hand:               Visualized             Rt Foot:                Nml heel/foot  Other  Umbilical Cord:        Normal 3-vessel        Genitalia:              Female-nml  Comment:     Technically difficult due to fetal position. ---------------------------------------------------------------------- Cervix Uterus Adnexa  Cervix  Length:              4  cm.  Normal appearance by transabdominal scan  Uterus  No abnormality visualized.  Right Ovary  Size(cm)     3.88   x   1.85   x  3.14      Vol(ml): 11.8  Within normal limits.  Left Ovary  Not visualized.  Cul De Sac  No free fluid seen.  Adnexa  No abnormality visualized ---------------------------------------------------------------------- Comments  Sonographic findings  Single intrauterine pregnancy at  23w 2d.  Fetal cardiac activity:  Observed and appears normal.  Presentation: Breech.  The views of the fetal anatomy were limited today due to the  fetal position.  What was visualized today appeared within  normal limits.  Fetal biometry shows the estimated fetal weight of 1 lb 3 oz,  537 grams (22%).  Amniotic fluid: Within normal limits.  MVP: 5.33 cm.  Placenta: Posterior.  Adnexa: No abnormality visualized.  Cervical length: 4 cm.  There are limitations of prenatal ultrasound such as the  inability to detect certain abnormalities due to poor  visualization. Various factors such as fetal position,  gestational age and maternal body habitus may increase the  difficulty in visualizing the fetal anatomy.  Recommendations  - See Epic note for assessment and plan of care. Any  referring office that does not utilize Epic will recieve a copy of  today's consult note via fax. Please contact our office with  any concerns. ----------------------------------------------------------------------                  Steffan Keys, MD Electronically Signed Final Report   08/01/2024  11:43 am ----------------------------------------------------------------------     Assessment and Plan: Patient Active Problem List   Diagnosis Date Noted   Preeclampsia, severe 08/30/2024   Chronic hypertension during pregnancy, antepartum 07/20/2024   Obesity affecting pregnancy 07/20/2024   PEC w/ SF:  Patient initially sent from MAU for severe range BP's. Does have history of uncontrolled cHTN.   -Admit to Antenatal -Betamethasone  x 2 doses -Magnesium  sulfate for CP prophylaxis -Ran through labetalol  hypertensive protocol here. Continues to have severe range BP's. -Continue home labetalol  800mg  BID. Added Nifedipine  60mg  daily. Will continue to titrate medications to ensure good blood pressure control. Initial PIH labs negative.  -Repeat PIH labs in the AM.  -Routine antenatal care  Barkley Angles, MD OB Fellow, Faculty Practice [facility], [facility]      [1]  Allergies Allergen Reactions   Lisinopril Nausea And Vomiting   Norethindrone Itching    Itchy hands and feet

## 2024-08-30 NOTE — MAU Note (Signed)
 Pt vomiting. RN offered nausea medications - pt declines at this time.

## 2024-08-30 NOTE — Progress Notes (Signed)
 After review, MFM consult with provider is not indicated for today  Arna Ranks, MD 08/30/2024 12:50 PM  [facility]

## 2024-08-31 ENCOUNTER — Other Ambulatory Visit: Payer: Self-pay

## 2024-08-31 LAB — COMPREHENSIVE METABOLIC PANEL WITH GFR
ALT: 15 U/L (ref 0–44)
AST: 17 U/L (ref 15–41)
Albumin: 3.8 g/dL (ref 3.5–5.0)
Alkaline Phosphatase: 80 U/L (ref 38–126)
Anion gap: 12 (ref 5–15)
BUN: 5 mg/dL — ABNORMAL LOW (ref 6–20)
CO2: 19 mmol/L — ABNORMAL LOW (ref 22–32)
Calcium: 7.5 mg/dL — ABNORMAL LOW (ref 8.9–10.3)
Chloride: 102 mmol/L (ref 98–111)
Creatinine, Ser: 0.48 mg/dL (ref 0.44–1.00)
GFR, Estimated: >60 mL/min
Glucose, Bld: 127 mg/dL — ABNORMAL HIGH (ref 70–99)
Potassium: 4.3 mmol/L (ref 3.5–5.1)
Sodium: 133 mmol/L — ABNORMAL LOW (ref 135–145)
Total Bilirubin: <0.2 mg/dL (ref 0.0–1.2)
Total Protein: 7 g/dL (ref 6.5–8.1)

## 2024-08-31 LAB — CBC
HCT: 33.1 % — ABNORMAL LOW (ref 36.0–46.0)
Hemoglobin: 11.1 g/dL — ABNORMAL LOW (ref 12.0–15.0)
MCH: 28.5 pg (ref 26.0–34.0)
MCHC: 33.5 g/dL (ref 30.0–36.0)
MCV: 85.1 fL (ref 80.0–100.0)
Platelets: 281 10*3/uL (ref 150–400)
RBC: 3.89 MIL/uL (ref 3.87–5.11)
RDW: 13.1 % (ref 11.5–15.5)
WBC: 14.3 10*3/uL — ABNORMAL HIGH (ref 4.0–10.5)
nRBC: 0 % (ref 0.0–0.2)

## 2024-08-31 LAB — PROTEIN / CREATININE RATIO, URINE
Creatinine, Urine: 23 mg/dL
Total Protein, Urine: <6 mg/dL

## 2024-08-31 MED ORDER — MAGNESIUM SULFATE 40 GM/1000ML IV SOLN: 2.0000 g/h | INTRAVENOUS | Status: AC

## 2024-08-31 NOTE — Consult Note (Signed)
 Neonatology Consult Note:  At the request of the patients obstetrician Dr. Henry I met with Anna Holt who is a 24 y.o. G1P0 at [redacted]w[redacted]d admitted for pre-eclampsia with severe features.  Current management includes Magnesium  sulfate x 24hr, labetalol  and procardia . Also receiving betamethasone .    We discussed morbidity/mortality at this gestional age, delivery room resuscitation, including intubation and surfactant in DR.  Discussed mechanical ventilation and risk for chronic lung disease, risk for IVH with potential for motor / cognitive deficits, ROP, NEC, sepsis, as well as temperature instability and feeding immaturity.  Discussed NG / OG feeds, benefits of MBM in reducing incidence of NEC.   Discussed likely length of stay.  Thank you for allowing us  to participate in her care.  Please call with questions.  Total clinician time devoted to the patient on the day of this visit excluding time spent on other separately reported services was 35 minutes.  Morene Kipper, DO  Neonatologist

## 2024-08-31 NOTE — Progress Notes (Signed)
 Exam discussed w/ Dr. Armond Plan developed to continue magnesium  sulfate until 0700 on 09/01/24 and for an MFM consult.

## 2024-08-31 NOTE — Progress Notes (Signed)
 Subjective:    Denies HA, visual changes, and RUQ pain. Expressed her desire to go home when possible   Objective:    VS: BP (!) 146/73 (BP Location: Right Arm)   Pulse (!) 107   Temp 97.6 F (36.4 C) (Oral)   Resp 18   Ht 5' 8 (1.727 m)   Wt 95.3 kg   LMP 02/20/2024   SpO2 98%   BMI 31.93 kg/m  FHR: baseline 135 / variability moderate / accelerations present 10x10 / absent decelerations Toco: contractions none Membranes: intact      Latest Ref Rng & Units 08/31/2024    4:28 AM 08/30/2024   12:45 PM 04/20/2024    1:30 AM  CBC  WBC 4.0 - 10.5 K/uL 14.3  11.6  16.4   Hemoglobin 12.0 - 15.0 g/dL 88.8  88.8  87.6   Hematocrit 36.0 - 46.0 % 33.1  33.6  36.2   Platelets 150 - 400 K/uL 281  272  305    CMP     Component Value Date/Time   NA 133 (L) 08/31/2024 0428   K 4.3 08/31/2024 0428   CL 102 08/31/2024 0428   CO2 19 (L) 08/31/2024 0428   GLUCOSE 127 (H) 08/31/2024 0428   BUN 5 (L) 08/31/2024 0428   CREATININE 0.48 08/31/2024 0428   CALCIUM  7.5 (L) 08/31/2024 0428   PROT 7.0 08/31/2024 0428   ALBUMIN 3.8 08/31/2024 0428   AST 17 08/31/2024 0428   ALT 15 08/31/2024 0428   ALKPHOS 80 08/31/2024 0428   BILITOT <0.2 08/31/2024 0428   GFRNONAA >60 08/31/2024 0428    Assessment/Plan:   24 y.o. 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

## 2024-08-31 NOTE — H&P (Addendum)
 Attestation signed by Izell Harari, MD at 08/31/2024  5:00 AM   Attestation of Attending Supervision of Fellow: Evaluation and management procedures were performed by the fellow under my supervision.  I have seen and examined the patient,  reviewed the fellow's note and chart, and I agree with the management and plan.    Patient with severe pre-eclampsia superimposed on CHTN, based on BPs *Admit to Ante *Severe pre-eclampsia: Mg x 24hr. Pt ordered for labetalol  800 bid (to restart on 2/6, since she's already received so much IV labetalol ) and procardia  60 qday. Follow up AM labs. Inpatient until delivery at 34wks. If patient has not had recent growth u/s at her primary OB's office, repeat recommended *Preterm: BMZ ordered. Neonatologist called for consult *PPx: SCDs *FEN/GI: regular diet, saline lock IVF  I told her that will d/w her re: [facility] attending re: CHTN exacerbation vs severe pre-eclampsia    Harari Izell Raddle MD Attending Center for [facility])                Expand All Collapse All  FACULTY PRACTICE ANTEPARTUM ADMISSION HISTORY AND PHYSICAL NOTE     History of Present Illness: Anna Holt is a 24 y.o. G1P0 at [redacted]w[redacted]d admitted for pre-eclampsia with severe features.   Patient reports the fetal movement as active. Patient reports uterine contraction  activity as none. Patient reports  vaginal bleeding as none. Patient describes fluid per vagina as None.         Patient Active Problem List    Diagnosis Date Noted   Preeclampsia, severe 08/30/2024   Chronic hypertension during pregnancy, antepartum 07/20/2024   Obesity affecting pregnancy 07/20/2024          Past Medical History:  Diagnosis Date   Hypertension                 Past Surgical History:  Procedure Laterality Date   ADENOIDECTOMY       TONSILLECTOMY       TYMPANOSTOMY TUBE PLACEMENT       WISDOM TOOTH EXTRACTION                               OB History   Gravida Para Term Preterm AB Living   1             SAB IAB Ectopic Multiple Live Births                        # Outcome Date GA Lbr Len/2nd Weight Sex Type Anes PTL Lv  1 Current                        Social History         Socioeconomic History   Marital status: Single      Spouse name: Not on file   Number of children: Not on file   Years of education: Not on file   Highest education level: Not on file  Occupational History   Not on file  Tobacco Use   Smoking status: Never   Smokeless tobacco: Never  Vaping Use   Vaping status: Never Used  Substance and Sexual Activity   Alcohol use: Never   Drug use: Never   Sexual activity: Not Currently  Other Topics Concern   Not on file  Social History Narrative   Not on file  Social Drivers of Health        Tobacco Use: Low Risk (08/30/2024)    Patient History     Smoking Tobacco Use: Never     Smokeless Tobacco Use: Never     Passive Exposure: Not on file  Financial Resource Strain: Not on file  Food Insecurity: No Food Insecurity (11/03/2023)    Hunger Vital Sign     Worried About Running Out of Food in the Last Year: Never true     Ran Out of Food in the Last Year: Never true  Transportation Needs: No Transportation Needs (11/03/2023)    PRAPARE - Therapist, Art (Medical): No     Lack of Transportation (Non-Medical): No  Physical Activity: Sufficiently Active (11/03/2023)    Exercise Vital Sign     Days of Exercise per Week: 5 days     Minutes of Exercise per Session: 60 min  Stress: Not on file  Social Connections: Not on file  Depression (EYV7-0): Not on file  Alcohol Screen: Not on file  Housing: Unknown (11/03/2023)    Housing Stability Vital Sign     Unable to Pay for Housing in the Last Year: No     Number of Times Moved in the Last Year: Not on file     Homeless in the Last Year: No  Utilities: Not At Risk (11/03/2023)    AHC Utilities     Threatened with loss of  utilities: No  Health Literacy: Not on file           Family History  Problem Relation Age of Onset   Hypertension Mother     Hypertension Maternal Grandfather            [Allergies]  [Allergies]      Allergen Reactions   Lisinopril Nausea And Vomiting   Norethindrone Itching      Itchy hands and feet            Medications Prior to Admission  Medication Sig Dispense Refill Last Dose/Taking   labetalol  (NORMODYNE ) 200 MG tablet Take 400 mg by mouth 2 (two) times daily.     08/30/2024   aspirin EC 81 MG tablet Take 81 mg by mouth daily. Swallow whole.         Prenatal Multivit-Min-Fe-FA (PRE-NATAL PO) Take 2 tablets by mouth daily at 6 (six) AM.         triamcinolone cream (KENALOG) 0.1 % Apply 1 Application topically 2 (two) times daily.                Review of Systems - Negative except what is listed in HPI   Vitals:  BP (!) 172/100 Comment: Dr. Magali at bedside  Pulse 95   Temp 98.3 F (36.8 C)   Resp 18   Ht 5' 8 (1.727 m)   Wt 95.3 kg   LMP 02/20/2024   SpO2 100%   BMI 31.93 kg/m  Physical Examination: CONSTITUTIONAL: Well-developed, well-nourished female in no acute distress.  HENT:  Normocephalic, atraumatic, External right and left ear normal. Oropharynx is clear and moist EYES: Conjunctivae and EOM are normal. Pupils are equal, round, and reactive to light. No scleral icterus.  NECK: Normal range of motion, supple, no masses SKIN: Skin is warm and dry. No rash noted. Not diaphoretic. No erythema. No pallor. NEUROLGIC: Alert and oriented to person, place, and time. Normal reflexes, muscle tone coordination. No cranial nerve deficit noted. PSYCHIATRIC: Normal mood and  affect. Normal behavior. Normal judgment and thought content. CARDIOVASCULAR: Normal heart rate noted, regular rhythm RESPIRATORY: Effort and breath sounds normal, no problems with respiration noted ABDOMEN: Soft, nontender, nondistended, gravid. MUSCULOSKELETAL: Normal range of motion.  No edema and no tenderness. 2+ distal pulses.   Cervix: Not evaluated Membranes:intact Fetal Monitoring:Baseline: 140 bpm, Variability: Good {> 6 bpm), Accelerations: Reactive, and Decelerations: Absent Tocometer: Flat   Labs:       Results for orders placed or performed during the hospital encounter of 08/30/24 (from the past 24 hours)  CBC    Collection Time: 08/30/24 12:45 PM  Result Value Ref Range    WBC 11.6 (H) 4.0 - 10.5 K/uL    RBC 3.90 3.87 - 5.11 MIL/uL    Hemoglobin 11.1 (L) 12.0 - 15.0 g/dL    HCT 66.3 (L) 63.9 - 46.0 %    MCV 86.2 80.0 - 100.0 fL    MCH 28.5 26.0 - 34.0 pg    MCHC 33.0 30.0 - 36.0 g/dL    RDW 86.7 88.4 - 84.4 %    Platelets 272 150 - 400 K/uL    nRBC 0.0 0.0 - 0.2 %  Comprehensive metabolic panel    Collection Time: 08/30/24 12:45 PM  Result Value Ref Range    Sodium 134 (L) 135 - 145 mmol/L    Potassium 4.5 3.5 - 5.1 mmol/L    Chloride 104 98 - 111 mmol/L    CO2 19 (L) 22 - 32 mmol/L    Glucose, Bld 73 70 - 99 mg/dL    BUN 6 6 - 20 mg/dL    Creatinine, Ser 9.48 0.44 - 1.00 mg/dL    Calcium  9.2 8.9 - 10.3 mg/dL    Total Protein 7.0 6.5 - 8.1 g/dL    Albumin 3.8 3.5 - 5.0 g/dL    AST 15 15 - 41 U/L    ALT 12 0 - 44 U/L    Alkaline Phosphatase 80 38 - 126 U/L    Total Bilirubin 0.2 0.0 - 1.2 mg/dL    GFR, Estimated >39 >39 mL/min    Anion gap 12 5 - 15  Protein / creatinine ratio, urine    Collection Time: 08/30/24  1:10 PM  Result Value Ref Range    Creatinine, Urine 82 mg/dL    Total Protein, Urine 13 mg/dL    Protein Creatinine Ratio 0.2 (H) <0.2 mg/mg      Imaging Studies: Imaging Results  US  MFM OB FOLLOW UP Result Date: 08/30/2024 ----------------------------------------------------------------------  OBSTETRICS REPORT                       (Signed Final 08/30/2024 12:27 pm) ---------------------------------------------------------------------- Patient Info  ID #:       [id number]                          D.O.B.:  28-Jul-2000 (23  yrs)(F)  Name:       EMPDRPOOJ Feliz               Visit Date: 08/30/2024 11:19 am ---------------------------------------------------------------------- Performed By  Attending:        Fredia Fresh MD        Ref. Address:     [address]  [address]                                                             [place]                                                             [address]  Performed By:     Emmaline Forte         [facility]  Referred By:      [facility] ---------------------------------------------------------------------- Orders  #  Description                           Code        Ordered By  1  US  MFM OB FOLLOW UP                   23183.98    BABARA KEYS ----------------------------------------------------------------------  #  Order #                     Accession #                Episode #  1  [id number]                   [id number]                 [id number] ---------------------------------------------------------------------- Indications  Pre-existing essential hypertension            O10.012  complicating pregnancy, second trimester  (Labetalol )  Obesity complicating pregnancy, second         O99.212  trimester (BMI 32)  Low risk NIPS female - Neg Horizon  Antenatal follow-up for nonvisualized fetal    Z36.2  anatomy  [redacted] weeks gestation of pregnancy  Z3A.27 ---------------------------------------------------------------------- Vital Signs  BP:          172/96 ---------------------------------------------------------------------- Fetal Evaluation  Num Of Fetuses:         1  Preg. Location:         Intrauterine  Fetal Heart Rate(bpm):  150  Cardiac Activity:        Observed  Presentation:           Cephalic  Placenta:               Posterior  P. Cord Insertion:      Visualized  Amniotic Fluid  AFI FV:      Within normal limits                              Largest Pocket(cm)                              4.41 ---------------------------------------------------------------------- Biometry  BPD:      68.8  mm     G. Age:  27w 5d         46  %    CI:        71.07   %    70 - 86                                                          FL/HC:      19.2   %    18.6 - 20.4  HC:       260   mm     G. Age:  28w 2d         48  %    HC/AC:      1.16        1.05 - 1.21  AC:      223.6  mm     G. Age:  26w 5d         23  %    FL/BPD:     72.5   %    71 - 87  FL:       49.9  mm     G. Age:  26w 6d         20  %    FL/AC:      22.3   %    20 - 24  LV:        3.3  mm  Est. FW:    1013  gm      2 lb 4 oz     23  % ---------------------------------------------------------------------- Gestational Age  LMP:           27w 3d        Date:  02/20/24                  EDD:   11/26/24  U/S Today:     27w 3d                                        EDD:   11/26/24  Best:  27w 3d     Det. By:  LMP  (02/20/24)          EDD:   11/26/24 ---------------------------------------------------------------------- Targeted Anatomy  Central Nervous System  Calvarium/Cranial V.:  Previously seen        Cereb./Vermis:          Previously seen  Cavum:                 Previously seen        Sales Executive:         Previously seen  Lateral Ventricles:    Appears normal         Midline Falx:           Previously seen  Choroid Plexus:        Previously seen  Spine  Cervical:              Previously seen        Sacral:                 Appears normal  Thoracic:              Previously seen        Shape/Curvature:        Previously seen  Lumbar:                Previously seen  Head/Neck  Lips:                  Previously seen        Profile:                Previously seen  Neck:                  Previously seen         Orbits/Eyes:            Previously seen  Nuchal Fold:           Not applicable         Mandible:               Previously seen  Nasal Bone:            Previously seen        Maxilla:                Previously seen  Thorax  4 Chamber View:        Appears normal         Interventr. Septum:     Previously seen  Cardiac Rhythm:        Normal                 Cardiac Axis:           Previously seen  Cardiac Situs:         Previously seen        Diaphragm:              Previously seen  Rt Outflow Tract:      Previously seen        3 Vessel View:          Previously seen  Lt Outflow Tract:      Previously seen        3 V Trachea View:       Previously seen  Aortic Arch:           Previously seen  IVC:                    Previously seen  Ductal Arch:           Previously seen        Crossing:               Previously seen  SVC:                   Previously seen  Abdomen  Ventral Wall:          Previously seen        Lt Kidney:              Appears normal  Cord Insertion:        Previously seen        Rt Kidney:              Appears normal  Situs:                 Previously seen        Bladder:                Appears normal  Stomach:               Appears normal  Extremities  Lt Humerus:            Previously seen        Lt Femur:               Previously seen  Rt Humerus:            Previously seen        Rt Femur:               Previously seen  Lt Forearm:            Previously seen        Lt Lower Leg:           Previously seen  Rt Forearm:            Previously seen        Rt Lower Leg:           Previously seen  Lt Hand:               Previously seen        Lt Foot:                Previously seen  Rt Hand:               Open hand nml          Rt Foot:                Previously seen  Other  Umbilical Cord:        Previously seen        Genitalia:              Female prev seen  Comment:     Fetal anatomic survey complete. ---------------------------------------------------------------------- Cervix Uterus Adnexa   Cervix  Not visualized (advanced GA >24wks)  Uterus  No abnormality visualized.  Right Ovary  Not visualized.  Left Ovary  Not visualized.  Cul De Sac  No free fluid seen.  Adnexa  No abnormality visualized ---------------------------------------------------------------------- Impression  Patient is here for fetal growth assessment and completion of  fetal anatomy. She has chronic hypertension and takes  labetalol . Blood pressures today at our office are  160/100 and  172/96 mm Hg. She does not have signs and symptoms of  severe features of preeclampsia.  Ultrasound  Normal fetal growth and amniotic fluid. Fetal anatomy  appears normal.  Patient took her labetalol  today morning. I discussed the  significance of severe range hypertension and recommended  evaluation at the MAU with series of blood pressures and  blood work. Superimposed preeclampsia needs to be ruled  out but may be difficult to differentiate.  Patient agreed to go to the MAU.  MAU team was informed. ---------------------------------------------------------------------- Recommendations  -Patient has an appointment for ultrasound in 4 weeks. ----------------------------------------------------------------------                 Fredia Fresh, MD Electronically Signed Final Report   08/30/2024 12:27 pm ----------------------------------------------------------------------    US  MFM OB DETAIL +14 WK Result Date: 08/01/2024 ----------------------------------------------------------------------  OBSTETRICS REPORT                       (Signed Final 08/01/2024 11:43 am) ---------------------------------------------------------------------- Patient Info  ID #:       [id number]                          D.O.B.:  2001-03-10 (23 yrs)(F)  Name:       Cj Recker               Visit Date: 08/01/2024 07:16 am ---------------------------------------------------------------------- Performed By  Attending:        Steffan Keys MD         Ref. Address:     [address]                                                              [address]                                                             [place]                                                             [address]  Performed By:     Rumaldo Sharps RDMS      Location[facility]         [facility]  Referred By:      [facility] ---------------------------------------------------------------------- Orders  #  Description                           Code        Ordered By  1  US  MFM OB DETAIL +14 WK               [id number]    ORIE BONUS ----------------------------------------------------------------------  #  Order #                     Accession #                Episode #  1  [id number]                   [id number]                 [id number] ---------------------------------------------------------------------- Indications  Pre-existing essential hypertension            O10.012  complicating pregnancy, second trimester  (Labetalol )  Obesity complicating pregnancy, second         O99.212  trimester (BMI 32)  Low risk NIPS female - Neg Horizon  Encounter for antenatal screening for          Z36.3  malformations  [redacted] weeks gestation of pregnancy                Z3A.23 ---------------------------------------------------------------------- Vital Signs  BP:          160/100 ---------------------------------------------------------------------- Fetal Evaluation  Num Of Fetuses:         1  Preg. Location:         Intrauterine  Fetal Heart Rate(bpm):  148  Cardiac Activity:       Observed  Presentation:           Breech  Placenta:               Posterior  P. Cord Insertion:      Visualized  Amniotic Fluid  AFI FV:      Within normal limits                              Largest Pocket(cm)                               5.33 ---------------------------------------------------------------------- Biometry  BPD:      54.2  mm     G. Age:  22w 3d         18  %    CI:        62.75   %    70 - 86  FL/HC:      17.9   %    19.2 - 20.8  HC:      220.7  mm     G. Age:  24w 1d         67  %    HC/AC:      1.24        1.05 - 1.21  AC:      177.9  mm     G. Age:  22w 5d         23  %    FL/BPD:     72.9   %    71 - 87  FL:       39.5  mm     G. Age:  22w 5d         21  %    FL/AC:      22.2   %    20 - 24  HUM:        37  mm     G. Age:  22w 6d         35  %  CER:      26.2  mm     G. Age:  23w 4d         84  %  LV:        7.1  mm  CM:        7.4  mm  Est. FW:     537  gm      1 lb 3 oz     22  % ---------------------------------------------------------------------- Gestational Age  LMP:           23w 2d        Date:  02/20/24                  EDD:   11/26/24  U/S Today:     23w 0d                                        EDD:   11/28/24  Best:          23w 2d     Det. By:  LMP  (02/20/24)          EDD:   11/26/24 ---------------------------------------------------------------------- Targeted Anatomy  Central Nervous System  Calvarium/Cranial V.:  Appears normal         Cereb./Vermis:          Appears normal  Cavum:                 Appears normal         Cisterna Magna:         Appears normal  Lateral Ventricles:    Appears normal         Midline Falx:           Appears normal  Choroid Plexus:        Appears normal  Spine  Cervical:              Appears normal         Sacral:                 Not well visualized  Thoracic:              Appears normal         Shape/Curvature:  Appears normal  Lumbar:                Appears normal  Head/Neck  Lips:                  Appears normal         Profile:                Appears normal  Neck:                  Appears normal         Orbits/Eyes:            Appears normal  Nuchal Fold:           Not applicable         Mandible:                Appears normal  Nasal Bone:            Present                Maxilla:                Appears normal  Thorax  4 Chamber View:        Appears normal         Interventr. Septum:     Appears normal  Cardiac Rhythm:        Normal                 Cardiac Axis:           Normal  Cardiac Situs:         Appears normal         Diaphragm:              Appears normal  Rt Outflow Tract:      Appears normal         3 Vessel View:          Appears normal  Lt Outflow Tract:      Appears normal         3 V Trachea View:       Appears normal  Aortic Arch:           Appears normal         IVC:                    Appears normal  Ductal Arch:           Appears normal         Crossing:               Appears normal  SVC:                   Appears normal  Abdomen  Ventral Wall:          Appears normal         Lt Kidney:              Appears normal  Cord Insertion:        Appears normal         Rt Kidney:              Appears normal  Situs:                 Appears normal         Bladder:  Appears normal  Stomach:               Appears normal  Extremities  Lt Humerus:            Appears normal         Lt Femur:               Appears normal  Rt Humerus:            Appears normal         Rt Femur:               Appears normal  Lt Forearm:            Appears normal         Lt Lower Leg:           Appears normal  Rt Forearm:            Appears normal         Rt Lower Leg:           Appears normal  Lt Hand:               Open hand nml          Lt Foot:                Nml heel/foot  Rt Hand:               Visualized             Rt Foot:                Nml heel/foot  Other  Umbilical Cord:        Normal 3-vessel        Genitalia:              Female-nml  Comment:     Technically difficult due to fetal position. ---------------------------------------------------------------------- Cervix Uterus Adnexa  Cervix  Length:              4  cm.  Normal appearance by transabdominal scan  Uterus  No abnormality visualized.  Right Ovary   Size(cm)     3.88   x   1.85   x  3.14      Vol(ml): 11.8  Within normal limits.  Left Ovary  Not visualized.  Cul De Sac  No free fluid seen.  Adnexa  No abnormality visualized ---------------------------------------------------------------------- Comments  Sonographic findings  Single intrauterine pregnancy at 23w 2d.  Fetal cardiac activity:  Observed and appears normal.  Presentation: Breech.  The views of the fetal anatomy were limited today due to the  fetal position.  What was visualized today appeared within  normal limits.  Fetal biometry shows the estimated fetal weight of 1 lb 3 oz,  537 grams (22%).  Amniotic fluid: Within normal limits.  MVP: 5.33 cm.  Placenta: Posterior.  Adnexa: No abnormality visualized.  Cervical length: 4 cm.  There are limitations of prenatal ultrasound such as the  inability to detect certain abnormalities due to poor  visualization. Various factors such as fetal position,  gestational age and maternal body habitus may increase the  difficulty in visualizing the fetal anatomy.  Recommendations  - See Epic note for assessment and plan of care. Any  referring office that does not utilize Epic will recieve a copy of  today's consult note via fax. Please contact our office with  any concerns. ----------------------------------------------------------------------  Steffan Keys, MD Electronically Signed Final Report   08/01/2024 11:43 am ----------------------------------------------------------------------          Assessment and Plan:     Patient Active Problem List    Diagnosis Date Noted   Preeclampsia, severe 08/30/2024   Chronic hypertension during pregnancy, antepartum 07/20/2024   Obesity affecting pregnancy 07/20/2024    PEC w/ SF:   Patient initially sent from MAU for severe range BP's. Does have history of uncontrolled cHTN.    -Admit to Antenatal -Betamethasone  x 2 doses -Magnesium  sulfate for CP prophylaxis -Ran through labetalol   hypertensive protocol here. Continues to have severe range BP's. -Continue home labetalol  800mg  BID. Added Nifedipine  60mg  daily. Will continue to titrate medications to ensure good blood pressure control. Initial PIH labs negative.  -Repeat PIH labs in the AM.  -Routine antenatal care   Barkley Angles, MD OB Fellow, Faculty Practice [facility], Center for [facility]

## 2024-08-31 NOTE — Plan of Care (Signed)
  Problem: Education: Goal: Knowledge of disease or condition will improve Outcome: Progressing Goal: Knowledge of the prescribed therapeutic regimen will improve Outcome: Progressing   Problem: Fluid Volume: Goal: Peripheral tissue perfusion will improve Outcome: Progressing   Problem: Clinical Measurements: Goal: Complications related to disease process, condition or treatment will be avoided or minimized Outcome: Progressing   Problem: Education: Goal: Knowledge of disease or condition will improve Outcome: Progressing Goal: Knowledge of the prescribed therapeutic regimen will improve Outcome: Progressing Goal: Individualized Educational Video(s) Outcome: Progressing   Problem: Clinical Measurements: Goal: Complications related to the disease process, condition or treatment will be avoided or minimized Outcome: Progressing   Problem: Education: Goal: Knowledge of General Education information will improve Description: Including pain rating scale, medication(s)/side effects and non-pharmacologic comfort measures Outcome: Progressing   Problem: Health Behavior/Discharge Planning: Goal: Ability to manage health-related needs will improve Outcome: Progressing   Problem: Clinical Measurements: Goal: Ability to maintain clinical measurements within normal limits will improve Outcome: Progressing Goal: Will remain free from infection Outcome: Progressing Goal: Diagnostic test results will improve Outcome: Progressing Goal: Respiratory complications will improve Outcome: Progressing Goal: Cardiovascular complication will be avoided Outcome: Progressing   Problem: Activity: Goal: Risk for activity intolerance will decrease Outcome: Progressing   Problem: Nutrition: Goal: Adequate nutrition will be maintained Outcome: Progressing   Problem: Coping: Goal: Level of anxiety will decrease Outcome: Progressing   Problem: Elimination: Goal: Will not experience  complications related to bowel motility Outcome: Progressing Goal: Will not experience complications related to urinary retention Outcome: Progressing   Problem: Pain Managment: Goal: General experience of comfort will improve and/or be controlled Outcome: Progressing   Problem: Safety: Goal: Ability to remain free from injury will improve Outcome: Progressing   Problem: Skin Integrity: Goal: Risk for impaired skin integrity will decrease Outcome: Progressing

## 2024-09-27 ENCOUNTER — Ambulatory Visit

## 2024-09-27 ENCOUNTER — Other Ambulatory Visit

## 2024-10-04 ENCOUNTER — Ambulatory Visit

## 2024-10-04 ENCOUNTER — Other Ambulatory Visit
